# Patient Record
Sex: Male | Born: 1950 | Race: White | Hispanic: No | Marital: Married | Smoking: Never smoker
Health system: Southern US, Community
[De-identification: ages and names within clinical notes are randomized; demographics above are authoritative.]

## PROBLEM LIST (undated history)

## (undated) DIAGNOSIS — M1990 Unspecified osteoarthritis, unspecified site: Secondary | ICD-10-CM

## (undated) DIAGNOSIS — E2740 Unspecified adrenocortical insufficiency: Secondary | ICD-10-CM

## (undated) DIAGNOSIS — J302 Other seasonal allergic rhinitis: Secondary | ICD-10-CM

## (undated) DIAGNOSIS — D352 Benign neoplasm of pituitary gland: Secondary | ICD-10-CM

## (undated) DIAGNOSIS — E039 Hypothyroidism, unspecified: Secondary | ICD-10-CM

## (undated) HISTORY — PX: SHOULDER SURGERY: SHX246

---

## 1989-01-21 HISTORY — PX: HERNIA REPAIR: SHX51

## 1998-01-21 HISTORY — PX: HAND SURGERY: SHX662

## 1998-04-06 ENCOUNTER — Ambulatory Visit (HOSPITAL_COMMUNITY): Admission: RE | Admit: 1998-04-06 | Discharge: 1998-04-06 | Payer: Self-pay | Admitting: Gastroenterology

## 2004-06-26 ENCOUNTER — Ambulatory Visit (HOSPITAL_BASED_OUTPATIENT_CLINIC_OR_DEPARTMENT_OTHER): Admission: RE | Admit: 2004-06-26 | Discharge: 2004-06-27 | Payer: Self-pay | Admitting: Orthopedic Surgery

## 2004-06-26 ENCOUNTER — Ambulatory Visit (HOSPITAL_COMMUNITY): Admission: RE | Admit: 2004-06-26 | Discharge: 2004-06-26 | Payer: Self-pay | Admitting: Orthopedic Surgery

## 2008-11-10 ENCOUNTER — Encounter: Admission: RE | Admit: 2008-11-10 | Discharge: 2008-11-10 | Payer: Self-pay | Admitting: Internal Medicine

## 2009-02-21 HISTORY — PX: PITUITARY EXCISION: SHX745

## 2009-02-22 ENCOUNTER — Inpatient Hospital Stay (HOSPITAL_COMMUNITY): Admission: AD | Admit: 2009-02-22 | Discharge: 2009-02-25 | Payer: Self-pay | Admitting: Internal Medicine

## 2009-03-03 DIAGNOSIS — E2749 Other adrenocortical insufficiency: Secondary | ICD-10-CM | POA: Insufficient documentation

## 2009-03-08 ENCOUNTER — Encounter (INDEPENDENT_AMBULATORY_CARE_PROVIDER_SITE_OTHER): Payer: Self-pay | Admitting: Neurosurgery

## 2009-03-08 ENCOUNTER — Inpatient Hospital Stay (HOSPITAL_COMMUNITY): Admission: RE | Admit: 2009-03-08 | Discharge: 2009-03-11 | Payer: Self-pay | Admitting: Neurosurgery

## 2009-11-23 ENCOUNTER — Ambulatory Visit (HOSPITAL_BASED_OUTPATIENT_CLINIC_OR_DEPARTMENT_OTHER): Admission: RE | Admit: 2009-11-23 | Discharge: 2009-11-24 | Payer: Self-pay | Admitting: Orthopedic Surgery

## 2010-02-11 ENCOUNTER — Encounter: Payer: Self-pay | Admitting: Orthopedic Surgery

## 2010-04-03 LAB — POCT HEMOGLOBIN-HEMACUE: Hemoglobin: 15.6 g/dL (ref 13.0–17.0)

## 2010-04-04 LAB — BASIC METABOLIC PANEL
BUN: 16 mg/dL (ref 6–23)
CO2: 31 mEq/L (ref 19–32)
Creatinine, Ser: 1.06 mg/dL (ref 0.4–1.5)
Glucose, Bld: 94 mg/dL (ref 70–99)
Potassium: 4 mEq/L (ref 3.5–5.1)
Sodium: 140 mEq/L (ref 135–145)

## 2010-04-11 LAB — BASIC METABOLIC PANEL
BUN: 16 mg/dL (ref 6–23)
BUN: 18 mg/dL (ref 6–23)
BUN: 20 mg/dL (ref 6–23)
CO2: 24 mEq/L (ref 19–32)
CO2: 31 mEq/L (ref 19–32)
Calcium: 8 mg/dL — ABNORMAL LOW (ref 8.4–10.5)
Calcium: 9.6 mg/dL (ref 8.4–10.5)
Chloride: 102 mEq/L (ref 96–112)
Chloride: 105 mEq/L (ref 96–112)
Chloride: 107 mEq/L (ref 96–112)
Creatinine, Ser: 0.86 mg/dL (ref 0.4–1.5)
Creatinine, Ser: 0.98 mg/dL (ref 0.4–1.5)
GFR calc Af Amer: >60 mL/min (ref >60)
GFR calc Af Amer: >60 mL/min (ref >60)
GFR calc Af Amer: >60 mL/min (ref >60)
GFR calc non Af Amer: >60 mL/min (ref >60)
GFR calc non Af Amer: >60 mL/min (ref >60)
GFR calc non Af Amer: >60 mL/min (ref >60)
Glucose, Bld: 98 mg/dL (ref 70–99)
Potassium: 3.6 mEq/L (ref 3.5–5.1)
Sodium: 135 mEq/L (ref 135–145)
Sodium: 136 mEq/L (ref 135–145)
Sodium: 140 mEq/L (ref 135–145)
Sodium: 143 mEq/L (ref 135–145)

## 2010-04-11 LAB — TYPE AND SCREEN
ABO/RH(D): O POS
Antibody Screen: NEGATIVE

## 2010-04-11 LAB — OSMOLALITY
Osmolality: 286 mOsm/kg (ref 275–300)
Osmolality: 293 mOsm/kg (ref 275–300)

## 2010-04-11 LAB — CBC
HCT: 38.6 % — ABNORMAL LOW (ref 39.0–52.0)
MCHC: 35.3 g/dL (ref 30.0–36.0)
Platelets: 233 10*3/uL (ref 150–400)
RDW: 13 % (ref 11.5–15.5)

## 2010-04-12 LAB — ACTH: C206 ACTH: 7 pg/mL — ABNORMAL LOW (ref 10–46)

## 2010-04-12 LAB — RENAL FUNCTION PANEL
Albumin: 4 g/dL (ref 3.5–5.2)
Albumin: 4.2 g/dL (ref 3.5–5.2)
BUN: 9 mg/dL (ref 6–23)
BUN: 9 mg/dL (ref 6–23)
CO2: 21 mEq/L (ref 19–32)
CO2: 23 mEq/L (ref 19–32)
CO2: 24 mEq/L (ref 19–32)
CO2: 25 mEq/L (ref 19–32)
Calcium: 8.1 mg/dL — ABNORMAL LOW (ref 8.4–10.5)
Chloride: 93 mEq/L — ABNORMAL LOW (ref 96–112)
Chloride: 96 mEq/L (ref 96–112)
Chloride: 97 mEq/L (ref 96–112)
Chloride: 97 mEq/L (ref 96–112)
Creatinine, Ser: 0.84 mg/dL (ref 0.4–1.5)
Creatinine, Ser: 1.16 mg/dL (ref 0.4–1.5)
GFR calc Af Amer: >60 mL/min (ref >60)
GFR calc Af Amer: >60 mL/min (ref >60)
GFR calc Af Amer: >60 mL/min (ref >60)
GFR calc non Af Amer: >60 mL/min (ref >60)
GFR calc non Af Amer: >60 mL/min (ref >60)
GFR calc non Af Amer: >60 mL/min (ref >60)
GFR calc non Af Amer: >60 mL/min (ref >60)
Glucose, Bld: 103 mg/dL — ABNORMAL HIGH (ref 70–99)
Glucose, Bld: 108 mg/dL — ABNORMAL HIGH (ref 70–99)
Phosphorus: 2.2 mg/dL — ABNORMAL LOW (ref 2.3–4.6)
Phosphorus: 2.6 mg/dL (ref 2.3–4.6)
Potassium: 4 mEq/L (ref 3.5–5.1)
Potassium: 4.3 mEq/L (ref 3.5–5.1)
Potassium: 4.5 mEq/L (ref 3.5–5.1)
Potassium: 4.7 mEq/L (ref 3.5–5.1)
Sodium: 127 mEq/L — ABNORMAL LOW (ref 135–145)
Sodium: 128 mEq/L — ABNORMAL LOW (ref 135–145)

## 2010-04-12 LAB — CBC
HCT: 38.1 % — ABNORMAL LOW (ref 39.0–52.0)
MCHC: 35.3 g/dL (ref 30.0–36.0)
MCV: 90.7 fL (ref 78.0–100.0)
RBC: 4.2 MIL/uL — ABNORMAL LOW (ref 4.22–5.81)
RDW: 12.8 % (ref 11.5–15.5)
WBC: 2.9 10*3/uL — ABNORMAL LOW (ref 4.0–10.5)

## 2010-04-12 LAB — BASIC METABOLIC PANEL
BUN: 18 mg/dL (ref 6–23)
BUN: 9 mg/dL (ref 6–23)
Calcium: 9 mg/dL (ref 8.4–10.5)
Chloride: 87 mEq/L — ABNORMAL LOW (ref 96–112)
GFR calc non Af Amer: >60 mL/min (ref >60)
Glucose, Bld: 102 mg/dL — ABNORMAL HIGH (ref 70–99)
Glucose, Bld: 88 mg/dL (ref 70–99)
Potassium: 3.9 mEq/L (ref 3.5–5.1)
Potassium: 4.1 mEq/L (ref 3.5–5.1)
Sodium: 115 mEq/L — CL (ref 135–145)
Sodium: 131 mEq/L — ABNORMAL LOW (ref 135–145)

## 2010-04-12 LAB — URINALYSIS, MICROSCOPIC ONLY
Bilirubin Urine: NEGATIVE
Glucose, UA: NEGATIVE mg/dL
Ketones, ur: NEGATIVE mg/dL
Leukocytes, UA: NEGATIVE
Nitrite: NEGATIVE
Protein, ur: NEGATIVE mg/dL
pH: 7.5 (ref 5.0–8.0)

## 2010-04-12 LAB — INSULIN-LIKE GROWTH FACTOR: Somatomedin C: 284

## 2010-04-12 LAB — CORTISOL: Cortisol, Plasma: 7.8 ug/dL

## 2010-04-12 LAB — OSMOLALITY: Osmolality: 238 mOsm/kg — CL (ref 275–300)

## 2010-04-12 LAB — FOLLICLE STIMULATING HORMONE: FSH: 6.4 m[IU]/mL (ref 1.4–18.1)

## 2010-05-14 ENCOUNTER — Other Ambulatory Visit: Payer: Self-pay | Admitting: Neurosurgery

## 2010-05-14 DIAGNOSIS — D497 Neoplasm of unspecified behavior of endocrine glands and other parts of nervous system: Secondary | ICD-10-CM

## 2010-05-21 ENCOUNTER — Ambulatory Visit
Admission: RE | Admit: 2010-05-21 | Discharge: 2010-05-21 | Disposition: A | Discharge status: Home / Self Care | Payer: BC Managed Care – PPO | Source: Ambulatory Visit | Attending: Neurosurgery | Admitting: Neurosurgery

## 2010-05-21 DIAGNOSIS — D497 Neoplasm of unspecified behavior of endocrine glands and other parts of nervous system: Secondary | ICD-10-CM

## 2010-05-21 MED ORDER — GADOBENATE DIMEGLUMINE 529 MG/ML IV SOLN
15.0000 mL | Freq: Once | INTRAVENOUS | Status: AC | PRN
  Administered 2010-05-21: 15 mL via INTRAVENOUS

## 2010-06-08 NOTE — Op Note (Signed)
Seth Estrada, Seth Estrada                     ACCOUNT NO.:  [id number]   MEDICAL RECORD NO.:  [id number]          PATIENT TYPE:  AMB   LOCATION:  DSC                          FACILITY:  [facility]   PHYSICIAN:  Katy Fitch. Sypher, M.D. DATE OF BIRTH:  05-09-1950   DATE OF PROCEDURE:  06/26/2004  DATE OF DISCHARGE:                                 OPERATIVE REPORT   PREOPERATIVE DIAGNOSIS:  Chronic left rotator cuff tear diagnosed in 2005,  status post chronic sub AC impingement and adhesive capsulitis for more than  12 months with MRI proven retracted degenerative tear of supraspinatus and  anterior infraspinatus for more than one year.   POSTOPERATIVE DIAGNOSIS:  Chronic left rotator cuff tear diagnosed in 2005,  status post chronic sub AC impingement and adhesive capsulitis for more than  12 months with MRI proven retracted degenerative tear of supraspinatus and  anterior infraspinatus for more than one year.   OPERATION:  1.  Diagnostic arthroscopy left glenohumeral joint followed by arthroscopic      capsulectomy and debridement of labral tear.  2.  Arthroscopic subacromial decompression with coracoacromial ligament      release and acromioplasty.  3.  Open resection of distal clavicle, i.e. Mumford procedure.  4.  Open reconstruction of retracted chronic supraspinatus and infraspinatus      rotator cuff tear.   OPERATING SURGEON:  Katy Fitch. Sypher, M.D.   ASSISTANT:  Molly Maduro Dasnoit PA-C.   ANESTHESIA:  General by endotracheal technique supplemented by interscalene  block.   SUPERVISING ANESTHESIOLOGIST:  Dr. Sheldon Silvan.   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 Muscle testing suggested  either tear of the  rotator cuff or significant tendinopathy of the  supraspinatus and infraspinatus.   An MRI was obtained in 2005 that documented a small anterior supraspinatus  rotator cuff tear. The [facility] joint was significantly degenerative. The acromial  morphology was type II at the [facility] joint and at the anterior lateral margin.   At the time, we recommended that Mr. Cupples consider arthroscopic  intervention and rotator cuff repair.   As he is planning on sailing off shore in the [place] for approximately 60 months, he elected to postpone surgery and perform a therapy program.   He returned in April 2006, and was noted to have signs of a chronic rotator  cuff tear. He was returned to the MRI unit for a study in  May 2006, which  documented a substantial retracted rotator cuff tear.   He is brought to the operating at this time to address his multiple shoulder  pathologies.   Preoperatively, he was advised the potential risks and benefits of surgery.  Question were provided and answered.   PROCEDURE:  Seth Estrada is brought to the operating room and placed in  supine position  on the operating table.   Following induction of general endotracheal anesthesia under direct  supervision of Dr. Sheldon Silvan,  Mr. Cookson was carefully positioned in the  beach-chair position with an aid of a torso and head holder designed for  shoulder arthroscopy.   Examination of the left shoulder under anesthesia revealed moderate adhesive  capsulitis with elevation 165, external rotation 75, internal rotation of 60  degrees.   With gentle manipulation, we increased the external rotation to 90 degrees  but did not attempt to increase the abduction or internal rotation.   The left arm was then prepped with DuraPrep and draped with impervious  arthroscopy drapes.   The shoulder joint was distended with 20 mL of sterile saline, brought in  with a spinal needle anteriorly followed by placement of the  arthroscope  through a standard posterior viewing portal. Diagnostic arthroscopy  documented adhesive capsulitis with scarring of the anterior capsular  ligaments. There was degenerative changes in the labrum at 12 o'clock, 1  o'clock anteriorly, 3 o'clock anteriorly and posteriorly and inferiorly the  labrum appeared to be intact.   The anterior glenohumeral ligaments were covered with granulation tissues  and were moderately adherent to the subscapularis tendon.   An anterior portal was created under direct vision followed by use of a  ArthroCare wand to take down the adhesions and a 4.5-mm suction shaver to  debride the adhesions and tenolyse the subscapularis tendon.   The origin of the biceps was stable. The biceps tendon was normal to the  rotator interval where there was a small inferior anterior partial-thickness  biceps tear due to chronic impingement. The deep surface the supraspinatus  and infraspinatus were easily visualized and found to have a necrotic  degenerative tear with some overlying scarring. There was an attempted deep  surface partial healing. However, this is poor quality scar.   After completion of hemostasis in the glenohumeral joint, the scope was  placed in subacromial space. A chronic bursitis was noted.   After bursectomy, the anatomy of the coracoacromial arch was studied. There  is clear prominence of the medial acromion at the [facility] joint and a  degenerative AC joint capsule. The cutting cautery was used to release the  coracoacromial ligament followed by leveling the acromion to a type I  morphology and hemostasis with the bipolar device.   Mr. Melucci had chronic capillary bleeding suggestive of chronic aspirin use  and/or other agents that may have inhibited his platelets.   We ultimately elected to proceed with open resection of the distal clavicle  due to bleeding that we encountered that made visualization challenging despite maintaining a systolic  blood pressure between 110 and 105.   The procedure then advanced to an open resection of the distal clavicle  through a 2 cm incision centered over the distal clavicle. Subperiosteal  dissection revealed the [facility] joint capsule which was taken down with scalpel  and sharp osteotomes. We once again encountered very difficult bleeding that  was challenging to control even with Bovie cautery. Mr. Yeley had been  taking aspirin 81 mg daily and had stopped one week prior surgery but still  had obviously impaired platelet function.   After resection of the distal 15 mm of clavicle, hemostasis was achieved by  packing the wound. We then split the anterior middle thirds of the deltoid  without significant release of the anterior deltoid. The retracted  degenerative rotator cuff tear was identified. The acromion was further  resected  with a hand rasp laterally to feather the margin with a smooth  upturn.   The necrotic margins of the supraspinatus and infraspinatus were retracted  followed by use of a rongeur to debride the deep surface. The greater  tuberosity was decorticated over a distance of 4 cm from the biceps interval  posteriorly followed by placement to [employer]crew anchors, one at the  central portion of the supraspinatus and one at the junction of  supraspinatus and infraspinatus.   Approximately 1.5 cm of necrotic cuff were resected followed by advancement  of the healthy appearing supraspinatus and infraspinatus laterally. The  tendons were inset with a total of three through bone McLaughlin type  sutures and a total of four mattress sutures inset along the joint margin to  create a broad footprint insetting the medial foot print of the  supraspinatus and infraspinatus.   The cuff repair was noted be quite satisfactory with a smooth margin.   The deltoid was then repaired to the periosteum and capsule of the [facility] joint  with mattress sutures with knots inverted, reconstructing the  deltoid split  interval and the anterior third of deltoid was repaired to the trapezius  closing the dead space created by resection of the distal clavicle.   Throughout the dissection, we had oozing that led to a blood loss of  approximate 300 mL. We may ask Mr. Wigington to have qualitative platelet  function checked prior to considering further surgery.   The wound was then repaired subdermal sutures of 2-0 Vicryl and intradermal  3-0 Prolene.   There were otherwise no apparent complications. Mr. Wien tolerated surgery  and anesthesia well. He was transferred to the recovery room with stable  vital signs.   He will be admitted to the recovery care center for observation of his vital  signs and appropriate analgesics in the form of IV and p.o. Dilaudid and PCA  morphine. We anticipate Ancef 1 g IV x3 doses over 24 hours.       RVS/MEDQ  D:  06/26/2004  T:  06/26/2004  Job:  [id number]

## 2012-02-05 ENCOUNTER — Other Ambulatory Visit: Payer: Self-pay | Admitting: Neurosurgery

## 2012-02-05 DIAGNOSIS — D497 Neoplasm of unspecified behavior of endocrine glands and other parts of nervous system: Secondary | ICD-10-CM

## 2012-03-03 ENCOUNTER — Ambulatory Visit
Admission: RE | Admit: 2012-03-03 | Discharge: 2012-03-03 | Disposition: A | Discharge status: Home / Self Care | Payer: BC Managed Care – PPO | Source: Ambulatory Visit | Attending: Neurosurgery | Admitting: Neurosurgery

## 2012-03-03 DIAGNOSIS — D497 Neoplasm of unspecified behavior of endocrine glands and other parts of nervous system: Secondary | ICD-10-CM

## 2012-03-03 MED ORDER — GADOBENATE DIMEGLUMINE 529 MG/ML IV SOLN
12.0000 mL | Freq: Once | INTRAVENOUS | Status: AC | PRN
  Administered 2012-03-03: 12 mL via INTRAVENOUS

## 2013-03-01 ENCOUNTER — Other Ambulatory Visit: Payer: Self-pay | Admitting: Neurosurgery

## 2013-03-01 DIAGNOSIS — Z9889 Other specified postprocedural states: Secondary | ICD-10-CM

## 2013-03-30 ENCOUNTER — Other Ambulatory Visit: Payer: BC Managed Care – PPO

## 2013-04-01 ENCOUNTER — Ambulatory Visit
Admission: RE | Admit: 2013-04-01 | Discharge: 2013-04-01 | Disposition: A | Discharge status: Home / Self Care | Payer: BC Managed Care – PPO | Source: Ambulatory Visit | Attending: Neurosurgery | Admitting: Neurosurgery

## 2013-04-01 DIAGNOSIS — Z9889 Other specified postprocedural states: Secondary | ICD-10-CM

## 2013-04-01 MED ORDER — GADOBENATE DIMEGLUMINE 529 MG/ML IV SOLN
10.0000 mL | Freq: Once | INTRAVENOUS | Status: AC | PRN
  Administered 2013-04-01: 10 mL via INTRAVENOUS

## 2014-01-30 ENCOUNTER — Observation Stay (HOSPITAL_COMMUNITY)
Admission: EM | Admit: 2014-01-30 | Discharge: 2014-01-31 | Disposition: A | Discharge status: Home / Self Care | Payer: BLUE CROSS/BLUE SHIELD | Attending: Internal Medicine | Admitting: Internal Medicine

## 2014-01-30 ENCOUNTER — Encounter (HOSPITAL_COMMUNITY): Payer: Self-pay | Admitting: Emergency Medicine

## 2014-01-30 DIAGNOSIS — Z8249 Family history of ischemic heart disease and other diseases of the circulatory system: Secondary | ICD-10-CM | POA: Insufficient documentation

## 2014-01-30 DIAGNOSIS — R112 Nausea with vomiting, unspecified: Secondary | ICD-10-CM | POA: Diagnosis present

## 2014-01-30 DIAGNOSIS — R197 Diarrhea, unspecified: Secondary | ICD-10-CM | POA: Diagnosis present

## 2014-01-30 DIAGNOSIS — I959 Hypotension, unspecified: Secondary | ICD-10-CM

## 2014-01-30 DIAGNOSIS — E860 Dehydration: Secondary | ICD-10-CM

## 2014-01-30 DIAGNOSIS — E271 Primary adrenocortical insufficiency: Secondary | ICD-10-CM

## 2014-01-30 DIAGNOSIS — E2740 Unspecified adrenocortical insufficiency: Principal | ICD-10-CM | POA: Diagnosis present

## 2014-01-30 DIAGNOSIS — Z800 Family history of malignant neoplasm of digestive organs: Secondary | ICD-10-CM | POA: Insufficient documentation

## 2014-01-30 HISTORY — DX: Unspecified adrenocortical insufficiency: E27.40

## 2014-01-30 HISTORY — DX: Benign neoplasm of pituitary gland: D35.2

## 2014-01-30 LAB — URINALYSIS, ROUTINE W REFLEX MICROSCOPIC
Bilirubin Urine: NEGATIVE
Glucose, UA: NEGATIVE mg/dL
Hgb urine dipstick: NEGATIVE
Ketones, ur: NEGATIVE mg/dL
LEUKOCYTES UA: NEGATIVE
NITRITE: NEGATIVE
Protein, ur: NEGATIVE mg/dL
Specific Gravity, Urine: 1.013 (ref 1.005–1.030)
Urobilinogen, UA: 0.2 mg/dL (ref 0.0–1.0)
pH: 7 (ref 5.0–8.0)

## 2014-01-30 LAB — COMPREHENSIVE METABOLIC PANEL
ALBUMIN: 4.5 g/dL (ref 3.5–5.2)
ALT: 18 U/L (ref 0–53)
AST: 27 U/L (ref 0–37)
Alkaline Phosphatase: 47 U/L (ref 39–117)
Anion gap: 7 (ref 5–15)
BUN: 25 mg/dL — ABNORMAL HIGH (ref 6–23)
CALCIUM: 8.9 mg/dL (ref 8.4–10.5)
CO2: 27 mmol/L (ref 19–32)
Chloride: 105 mEq/L (ref 96–112)
Creatinine, Ser: 1.2 mg/dL (ref 0.50–1.35)
GFR calc Af Amer: 73 mL/min — ABNORMAL LOW (ref >90)
GFR, EST NON AFRICAN AMERICAN: 63 mL/min — AB (ref >90)
Glucose, Bld: 107 mg/dL — ABNORMAL HIGH (ref 70–99)
Potassium: 3.8 mmol/L (ref 3.5–5.1)
Sodium: 139 mmol/L (ref 135–145)
TOTAL PROTEIN: 7 g/dL (ref 6.0–8.3)
Total Bilirubin: 1.2 mg/dL (ref 0.3–1.2)

## 2014-01-30 LAB — CBC WITH DIFFERENTIAL/PLATELET
BASOS ABS: 0 10*3/uL (ref 0.0–0.1)
Basophils Relative: 0 % (ref 0–1)
EOS PCT: 0 % (ref 0–5)
Eosinophils Absolute: 0 10*3/uL (ref 0.0–0.7)
HEMATOCRIT: 50.7 % (ref 39.0–52.0)
Hemoglobin: 16.9 g/dL (ref 13.0–17.0)
Lymphocytes Relative: 8 % — ABNORMAL LOW (ref 12–46)
Lymphs Abs: 0.9 10*3/uL (ref 0.7–4.0)
MCH: 31.4 pg (ref 26.0–34.0)
MCHC: 33.3 g/dL (ref 30.0–36.0)
MCV: 94.2 fL (ref 78.0–100.0)
MONO ABS: 0.4 10*3/uL (ref 0.1–1.0)
Monocytes Relative: 4 % (ref 3–12)
Neutro Abs: 9 10*3/uL — ABNORMAL HIGH (ref 1.7–7.7)
Neutrophils Relative %: 88 % — ABNORMAL HIGH (ref 43–77)
PLATELETS: 140 10*3/uL — AB (ref 150–400)
RBC: 5.38 MIL/uL (ref 4.22–5.81)
RDW: 12.8 % (ref 11.5–15.5)
WBC: 10.3 10*3/uL (ref 4.0–10.5)

## 2014-01-30 LAB — CORTISOL: Cortisol, Plasma: 32.2 ug/dL

## 2014-01-30 LAB — LIPASE, BLOOD: Lipase: 36 U/L (ref 11–59)

## 2014-01-30 LAB — TROPONIN I
Troponin I: <0.03 ng/mL (ref <0.031)
Troponin I: <0.03 ng/mL (ref <0.031)
Troponin I: <0.03 ng/mL (ref <0.031)

## 2014-01-30 LAB — TSH: TSH: 1.029 u[IU]/mL (ref 0.350–4.500)

## 2014-01-30 MED ORDER — ASPIRIN EC 81 MG PO TBEC
81.0000 mg | DELAYED_RELEASE_TABLET | Freq: Every day | ORAL | Status: DC
  Administered 2014-01-30 – 2014-01-31 (×2): 81 mg via ORAL
  Filled 2014-01-30 (×2): qty 1

## 2014-01-30 MED ORDER — HYDROCORTISONE NA SUCCINATE PF 100 MG IJ SOLR
50.0000 mg | Freq: Three times a day (TID) | INTRAMUSCULAR | Status: DC
  Administered 2014-01-30 – 2014-01-31 (×4): 50 mg via INTRAVENOUS
  Filled 2014-01-30 (×7): qty 1

## 2014-01-30 MED ORDER — ACETAMINOPHEN 325 MG PO TABS
650.0000 mg | ORAL_TABLET | Freq: Four times a day (QID) | ORAL | Status: DC | PRN
  Administered 2014-01-30 – 2014-01-31 (×2): 650 mg via ORAL
  Filled 2014-01-30 (×2): qty 2

## 2014-01-30 MED ORDER — SODIUM CHLORIDE 0.9 % IV SOLN: INTRAVENOUS | Status: DC

## 2014-01-30 MED ORDER — LEVOTHYROXINE SODIUM 50 MCG PO TABS
50.0000 ug | ORAL_TABLET | Freq: Every day | ORAL | Status: DC
  Administered 2014-01-30 – 2014-01-31 (×2): 50 ug via ORAL
  Filled 2014-01-30 (×3): qty 1

## 2014-01-30 MED ORDER — METHYLPREDNISOLONE SODIUM SUCC 125 MG IJ SOLR
80.0000 mg | Freq: Once | INTRAMUSCULAR | Status: DC
  Filled 2014-01-30: qty 1.28

## 2014-01-30 MED ORDER — SODIUM CHLORIDE 0.9 % IV SOLN
1000.0000 mL | INTRAVENOUS | Status: DC
  Administered 2014-01-30 (×2): 1000 mL via INTRAVENOUS

## 2014-01-30 MED ORDER — SODIUM CHLORIDE 0.9 % IV BOLUS (SEPSIS)
1000.0000 mL | Freq: Once | INTRAVENOUS | Status: AC
  Administered 2014-01-30: 1000 mL via INTRAVENOUS

## 2014-01-30 MED ORDER — ACETAMINOPHEN 650 MG RE SUPP: 650.0000 mg | Freq: Four times a day (QID) | RECTAL | Status: DC | PRN

## 2014-01-30 MED ORDER — ONDANSETRON HCL 4 MG/2ML IJ SOLN: 4.0000 mg | Freq: Four times a day (QID) | INTRAMUSCULAR | Status: DC | PRN

## 2014-01-30 MED ORDER — HYDROCORTISONE NA SUCCINATE PF 100 MG IJ SOLR: 100.0000 mg | Freq: Once | INTRAMUSCULAR | Status: DC

## 2014-01-30 MED ORDER — TESTOSTERONE CYPIONATE 200 MG/ML IM SOLN: 120.0000 mg | INTRAMUSCULAR | Status: DC

## 2014-01-30 MED ORDER — HYDROCORTISONE 5 MG PO TABS
25.0000 mg | ORAL_TABLET | Freq: Every day | ORAL | Status: DC
  Filled 2014-01-30 (×2): qty 1

## 2014-01-30 MED ORDER — HYDROCORTISONE SOD SUCCINATE 100 MG PF FOR IT USE: 100.0000 mg | Freq: Once | INTRAMUSCULAR | Status: DC

## 2014-01-30 MED ORDER — SODIUM CHLORIDE 0.9 % IV SOLN
1000.0000 mL | Freq: Once | INTRAVENOUS | Status: AC
  Administered 2014-01-30: 1000 mL via INTRAVENOUS

## 2014-01-30 MED ORDER — SODIUM CHLORIDE 0.9 % IV SOLN
1000.0000 mL | INTRAVENOUS | Status: DC
  Administered 2014-01-30 – 2014-01-31 (×2): 1000 mL via INTRAVENOUS

## 2014-01-30 MED ORDER — ONDANSETRON HCL 4 MG/2ML IJ SOLN: 4.0000 mg | Freq: Three times a day (TID) | INTRAMUSCULAR | Status: DC | PRN

## 2014-01-30 MED ORDER — ROSUVASTATIN CALCIUM 5 MG PO TABS
5.0000 mg | ORAL_TABLET | Freq: Every day | ORAL | Status: DC
  Administered 2014-01-30: 5 mg via ORAL
  Filled 2014-01-30 (×2): qty 1

## 2014-01-30 MED ORDER — HYDROCORTISONE NA SUCCINATE PF 100 MG IJ SOLR
100.0000 mg | Freq: Once | INTRAMUSCULAR | Status: DC
  Filled 2014-01-30: qty 2

## 2014-01-30 MED ORDER — SODIUM CHLORIDE 0.9 % IJ SOLN
3.0000 mL | Freq: Two times a day (BID) | INTRAMUSCULAR | Status: DC
  Administered 2014-01-30: 3 mL via INTRAVENOUS

## 2014-01-30 MED ORDER — HYDROCORTISONE NA SUCCINATE PF 100 MG IJ SOLR
100.0000 mg | Freq: Once | INTRAMUSCULAR | Status: AC
  Administered 2014-01-30: 100 mg via INTRAVENOUS
  Filled 2014-01-30: qty 2

## 2014-01-30 NOTE — ED Provider Notes (Signed)
CSN: [id number]     Arrival date & time 01/30/14  0113 History   First MD Initiated Contact with Patient 01/30/14 0207     Chief Complaint  Patient presents with  . Emesis  . Diarrhea     (Consider location/radiation/quality/duration/timing/severity/associated sxs/prior Treatment) HPI Patient has a history of adrenal insufficiency. He states he has taken all of his hydrocortisone today. He states he had a bloated feeling all afternoon and about 10:30 this evening he started having vomiting about 3 times. He also had diarrhea twice that was very watery. He complains of feeling shaky. He denies being lightheaded or dizzy. His wife drove him to the ED. He denies being around anybody else who is sick. He denies eating anything that he thinks has made him feel bad.    PCP Dr America Brown Endocrinology Dr Buddy Duty  Past Medical History  Diagnosis Date  . Renal disorder   . Adrenal insufficiency   . Pituitary adenoma    Past Surgical History  Procedure Laterality Date  . Pituitary excision  02/2009    [facility]  . Shoulder surgery      bilateral rotator cuff  . Hand surgery    . Hernia repair     Family History  Problem Relation Age of Onset  . CAD Mother   . Colon cancer Father    History  Substance Use Topics  . Smoking status: Never Smoker   . Smokeless tobacco: Never Used  . Alcohol Use: No  lives at home Lives with spouse retired  Review of Systems  All other systems reviewed and are negative.     Allergies  Review of patient's allergies indicates no known allergies.  Home Medications   Prior to Admission medications   Medication Sig Start Date End Date Taking? Authorizing Provider  aspirin EC 81 MG tablet Take 81 mg by mouth daily.   Yes Historical Provider, MD  hydrocortisone (CORTEF) 10 MG tablet 15 mg in the morning, 5 mg in the early afternoon, 5 mg in the late afternoon 12/04/13  Yes Historical Provider, MD  levothyroxine (SYNTHROID, LEVOTHROID) 50 MCG  tablet Take 50 mcg by mouth.   Yes Historical Provider, MD  rosuvastatin (CRESTOR) 5 MG tablet Take 5 mg by mouth.   Yes Historical Provider, MD  testosterone cypionate (DEPOTESTOTERONE CYPIONATE) 200 MG/ML injection Inject 0.6 mLs into the muscle every 14 (fourteen) days. 12/13/13  Yes Historical Provider, MD   ED Triage Vitals  Enc Vitals Group     BP 01/30/14 0119 108/94 mmHg     Pulse Rate 01/30/14 0119 111     Resp 01/30/14 0119 18     Temp 01/30/14 0119 98.3 F (36.8 C)     Temp Source 01/30/14 0119 Oral     SpO2 01/30/14 0119 97 %     Weight 01/30/14 0119 205 lb (92.987 kg)     Height 01/30/14 0119 5\' 11"  (1.803 m)     Head Cir --      Peak Flow --      Pain Score 01/30/14 0130 0     Pain Loc --      Pain Edu? --      Excl. in [place]? --     Vital signs normal except for tachycardia    Physical Exam  Constitutional: He is oriented to person, place, and time. He appears well-developed and well-nourished.  Non-toxic appearance. He does not appear ill. He appears distressed.  HENT:  Head: Normocephalic and  atraumatic.  Right Ear: External ear normal.  Left Ear: External ear normal.  Nose: Nose normal. No mucosal edema or rhinorrhea.  Mouth/Throat: Mucous membranes are normal. No dental abscesses or uvula swelling.  Tongue is dry  Eyes: Conjunctivae and EOM are normal. Pupils are equal, round, and reactive to light.  Neck: Normal range of motion and full passive range of motion without pain. Neck supple.  Cardiovascular: Normal rate, regular rhythm and normal heart sounds.  Exam reveals no gallop and no friction rub.   No murmur heard. Pulmonary/Chest: Effort normal and breath sounds normal. No respiratory distress. He has no wheezes. He has no rhonchi. He has no rales. He exhibits no tenderness and no crepitus.  Abdominal: Soft. Normal appearance and bowel sounds are normal. He exhibits no distension. There is no tenderness. There is no rebound and no guarding.   Musculoskeletal: Normal range of motion. He exhibits no edema or tenderness.  Moves all extremities well.   Neurological: He is alert and oriented to person, place, and time. He has normal strength. No cranial nerve deficit.  Patient appear shaky  Skin: Skin is warm, dry and intact. No rash noted. No erythema. No pallor.  Psychiatric: His speech is normal and behavior is normal. His mood appears anxious.  Nursing note and vitals reviewed.   ED Course  Procedures (including critical care time)  Medications  0.9 %  sodium chloride infusion (0 mLs Intravenous Stopped 01/30/14 0317)    Followed by  0.9 %  sodium chloride infusion (0 mLs Intravenous Stopped 01/30/14 0600)    Followed by  0.9 %  sodium chloride infusion (not administered)  hydrocortisone sodium succinate (SOLU-CORTEF) 100 MG injection 100 mg (not administered)  hydrocortisone sodium succinate (SOLU-CORTEF) 100 MG injection 100 mg (100 mg Intravenous Given 01/30/14 0236)  sodium chloride 0.9 % bolus 1,000 mL (0 mLs Intravenous Stopped 01/30/14 0600)   Shortly after patient was placed in a room he dropped his blood pressure to 89/21 systolic. 2 IVs were started for IV fluids. His blood pressure responded to IV fluids and 100 mg of IV solucortef.   04:00 Patient was rechecked and is feeling better. He has not had urinary output after 2 L of normal saline. A third liter was ordered. Patient also is agreeable to trying oral fluids.  Recheck 05:45 pt is in the bathroom having 3rd episode of watery diarrhea since he has been to the ED. States he is feeling better. Is agreeable for obs admission b/o his adrenal insufficiency.   06:02 Dr Maudie Mercury, admit to observation  Labs Review Results for orders placed or performed during the hospital encounter of 01/30/14  CBC with Differential  Result Value Ref Range   WBC 10.3 4.0 - 10.5 K/uL   RBC 5.38 4.22 - 5.81 MIL/uL   Hemoglobin 16.9 13.0 - 17.0 g/dL   HCT 50.7 39.0 - 52.0 %   MCV 94.2  78.0 - 100.0 fL   MCH 31.4 26.0 - 34.0 pg   MCHC 33.3 30.0 - 36.0 g/dL   RDW 12.8 11.5 - 15.5 %   Platelets 140 (L) 150 - 400 K/uL   Neutrophils Relative % 88 (H) 43 - 77 %   Neutro Abs 9.0 (H) 1.7 - 7.7 K/uL   Lymphocytes Relative 8 (L) 12 - 46 %   Lymphs Abs 0.9 0.7 - 4.0 K/uL   Monocytes Relative 4 3 - 12 %   Monocytes Absolute 0.4 0.1 - 1.0 K/uL   Eosinophils Relative  0 0 - 5 %   Eosinophils Absolute 0.0 0.0 - 0.7 K/uL   Basophils Relative 0 0 - 1 %   Basophils Absolute 0.0 0.0 - 0.1 K/uL  Comprehensive metabolic panel  Result Value Ref Range   Sodium 139 135 - 145 mmol/L   Potassium 3.8 3.5 - 5.1 mmol/L   Chloride 105 96 - 112 mEq/L   CO2 27 19 - 32 mmol/L   Glucose, Bld 107 (H) 70 - 99 mg/dL   BUN 25 (H) 6 - 23 mg/dL   Creatinine, Ser 1.20 0.50 - 1.35 mg/dL   Calcium 8.9 8.4 - 10.5 mg/dL   Total Protein 7.0 6.0 - 8.3 g/dL   Albumin 4.5 3.5 - 5.2 g/dL   AST 27 0 - 37 U/L   ALT 18 0 - 53 U/L   Alkaline Phosphatase 47 39 - 117 U/L   Total Bilirubin 1.2 0.3 - 1.2 mg/dL   GFR calc non Af Amer 63 (L) >90 mL/min   GFR calc Af Amer 73 (L) >90 mL/min   Anion gap 7 5 - 15  Lipase, blood  Result Value Ref Range   Lipase 36 11 - 59 U/L  Urinalysis, Routine w reflex microscopic  Result Value Ref Range   Color, Urine YELLOW YELLOW   APPearance CLEAR CLEAR   Specific Gravity, Urine 1.013 1.005 - 1.030   pH 7.0 5.0 - 8.0   Glucose, UA NEGATIVE NEGATIVE mg/dL   Hgb urine dipstick NEGATIVE NEGATIVE   Bilirubin Urine NEGATIVE NEGATIVE   Ketones, ur NEGATIVE NEGATIVE mg/dL   Protein, ur NEGATIVE NEGATIVE mg/dL   Urobilinogen, UA 0.2 0.0 - 1.0 mg/dL   Nitrite NEGATIVE NEGATIVE   Leukocytes, UA NEGATIVE NEGATIVE   Laboratory interpretation all normal except mildly elevated BUN consistent with dehydration     Imaging Review No results found.   EKG Interpretation   Date/Time:  Sunday January 30 2014 02:06:36 EST Ventricular Rate:  85 PR Interval:  182 QRS Duration:  84 QT Interval:  375 QTC Calculation: 446 R Axis:   -28 Text Interpretation:  Sinus rhythm Borderline left axis deviation Low  voltage, extremity and precordial leads Since last tracing rate faster (02 Mar 2009) Confirmed by [facility]  MD-I, Raynisha Avilla ([id number]) on 01/30/2014 2:15:20 AM      MDM   Final diagnoses:  Nausea vomiting and diarrhea  Dehydration  Hypotension, unspecified hypotension type  Adrenal insufficiency (Addison's disease)    Plan admission to observation   Rolland Porter, MD, [place] Performed by: Rolland Porter L Total critical care time: 35 min Critical care time was exclusive of separately billable procedures and treating other patients. Critical care was necessary to treat or prevent imminent or life-threatening deterioration. Critical care was time spent personally by me on the following activities: development of treatment plan with patient and/or surrogate as well as nursing, discussions with consultants, evaluation of patient's response to treatment, examination of patient, obtaining history from patient or surrogate, ordering and performing treatments and interventions, ordering and review of laboratory studies, ordering and review of radiographic studies, pulse oximetry and re-evaluation of patient's condition.     Janice Norrie, MD 01/30/14 [phone]

## 2014-01-30 NOTE — ED Notes (Signed)
HX OF ADRENAL issues NOT RENAL

## 2014-01-30 NOTE — Progress Notes (Signed)
  [facility] PROGRESS NOTE   Seth Estrada OPF:[id number] DOB: 1950-11-25 DOA: 01/30/2014 PCP: Marjorie Smolder, MD  HPI/Subjective: Feels much better, denies any nausea vomiting this morning, still has loose stools. Patient seen and examined earlier today by my colleague Dr. Maudie Mercury.  Assessment/Plan: Active Problems:   Adrenal insufficiency   Nausea vomiting and diarrhea   Nausea vomiting and diarrhea Likely transient viral gastroenteritis Improving, advance diet as tolerated.  Adrenal insufficiency Placed on a stress dose of Solu-Cortef, 50 mg 3 times a day. Likely can be discharged tomorrow if no other complaints.  Code Status: Full code Family Communication: Plan discussed with the patient. Disposition Plan: Remains inpatient   Consultants:  None  Procedures:  None  Antibiotics:  None   Objective: Filed Vitals:   01/30/14 0655  BP: 123/74  Pulse: 95  Temp: 99.1 F (37.3 C)  Resp: 18   No intake or output data in the 24 hours ending 01/30/14 1254 Filed Weights   01/30/14 0119  Weight: 92.987 kg (205 lb)    Exam: General: Alert and awake, oriented x3, not in any acute distress. HEENT: anicteric sclera, pupils reactive to light and accommodation, EOMI CVS: S1-S2 clear, no murmur rubs or gallops Chest: clear to auscultation bilaterally, no wheezing, rales or rhonchi Abdomen: soft nontender, nondistended, normal bowel sounds, no organomegaly Extremities: no cyanosis, clubbing or edema noted bilaterally Neuro: Cranial nerves II-XII intact, no focal neurological deficits  Data Reviewed: Basic Metabolic Panel:  Recent Labs Lab 01/30/14 0208  NA 139  K 3.8  CL 105  CO2 27  GLUCOSE 107*  BUN 25*  CREATININE 1.20  CALCIUM 8.9   Liver Function Tests:  Recent Labs Lab 01/30/14 0208  AST 27  ALT 18  ALKPHOS 47  BILITOT 1.2  PROT 7.0  ALBUMIN 4.5    Recent Labs Lab 01/30/14 0208  LIPASE 36   No results for input(s): AMMONIA  in the last 168 hours. CBC:  Recent Labs Lab 01/30/14 0208  WBC 10.3  NEUTROABS 9.0*  HGB 16.9  HCT 50.7  MCV 94.2  PLT 140*   Cardiac Enzymes:  Recent Labs Lab 01/30/14 0710  TROPONINI <0.03   BNP (last 3 results) No results for input(s): PROBNP in the last 8760 hours. CBG: No results for input(s): GLUCAP in the last 168 hours.  Micro No results found for this or any previous visit (from the past 240 hour(s)).   Studies: No results found.  Scheduled Meds: . aspirin EC  81 mg Oral Daily  . hydrocortisone sod succinate (SOLU-CORTEF) inj  50 mg Intravenous Q8H  . levothyroxine  50 mcg Oral QAC breakfast  . rosuvastatin  5 mg Oral q1800  . sodium chloride  3 mL Intravenous Q12H   Continuous Infusions: . sodium chloride 1,000 mL (01/30/14 1206)       Time spent: 35 minutes    [facility] A  [facility] Pager [phone] If 7PM-7AM, please contact night-coverage at [email or web address], password [facility] 01/30/2014, 12:54 PM  LOS: 0 days

## 2014-01-30 NOTE — ED Notes (Signed)
Pt arrived to the ED with a complaint of sudden onset of emesis and diarrhea.  Pt states that he ate a large meal, felt bloated and then mad himself throw up around 2230.  Pt has had three episodes of emesis that were large.  Pt states that he has also had two episodes of diarrhea since eating.  Pt has renal issues and has been told by PCP to come to ED when excessive emesis or diarrhea has occurred

## 2014-01-30 NOTE — ED Notes (Signed)
Hospitalist at bedside 

## 2014-01-30 NOTE — ED Notes (Signed)
Report given to Control and instrumentation engineer on 5th floor

## 2014-01-30 NOTE — ED Notes (Signed)
Provided pt with urine and he is aware a sample is needed. Will notify when he has voided

## 2014-01-30 NOTE — H&P (Signed)
Seth Estrada is an 64 y.o. male.     Pcp: Inda Merlin Neurosurgery: Allyson Sabal  Chief Complaint: n/v, diarrhea HPI: 64 yo male with hx adrenal insufficiency, apparently felt ill, had n/v, diarrheax3,  Loose stool yesterday and felt diaphoretic.  Pt had increased his hydrocortisone but still felt ill, and therefore presented to ED. Pt was noted to be hypotensive and responded to fluid.  Pt will be observed for adrenal insufficiency.   Past Medical History  Diagnosis Date  . Renal disorder   . Adrenal insufficiency   . Pituitary adenoma     Past Surgical History  Procedure Laterality Date  . Pituitary excision  02/2009    Newman Pies  . Shoulder surgery      bilateral rotator cuff  . Hand surgery    . Hernia repair      Family History  Problem Relation Age of Onset  . CAD Mother   . Colon cancer Father    Social History:  reports that he has never smoked. He has never used smokeless tobacco. He reports that he does not drink alcohol or use illicit drugs.  Allergies: No Known Allergies   (Not in a hospital admission)  Results for orders placed or performed during the hospital encounter of 01/30/14 (from the past 48 hour(s))  CBC with Differential     Status: Abnormal   Collection Time: 01/30/14  2:08 AM  Result Value Ref Range   WBC 10.3 4.0 - 10.5 K/uL   RBC 5.38 4.22 - 5.81 MIL/uL   Hemoglobin 16.9 13.0 - 17.0 g/dL   HCT 50.7 39.0 - 52.0 %   MCV 94.2 78.0 - 100.0 fL   MCH 31.4 26.0 - 34.0 pg   MCHC 33.3 30.0 - 36.0 g/dL   RDW 12.8 11.5 - 15.5 %   Platelets 140 (L) 150 - 400 K/uL   Neutrophils Relative % 88 (H) 43 - 77 %   Neutro Abs 9.0 (H) 1.7 - 7.7 K/uL   Lymphocytes Relative 8 (L) 12 - 46 %   Lymphs Abs 0.9 0.7 - 4.0 K/uL   Monocytes Relative 4 3 - 12 %   Monocytes Absolute 0.4 0.1 - 1.0 K/uL   Eosinophils Relative 0 0 - 5 %   Eosinophils Absolute 0.0 0.0 - 0.7 K/uL   Basophils Relative 0 0 - 1 %   Basophils Absolute 0.0 0.0 - 0.1 K/uL   Comprehensive metabolic panel     Status: Abnormal   Collection Time: 01/30/14  2:08 AM  Result Value Ref Range   Sodium 139 135 - 145 mmol/L    Comment: Please note change in reference range.   Potassium 3.8 3.5 - 5.1 mmol/L    Comment: Please note change in reference range.   Chloride 105 96 - 112 mEq/L   CO2 27 19 - 32 mmol/L   Glucose, Bld 107 (H) 70 - 99 mg/dL   BUN 25 (H) 6 - 23 mg/dL   Creatinine, Ser 1.20 0.50 - 1.35 mg/dL   Calcium 8.9 8.4 - 10.5 mg/dL   Total Protein 7.0 6.0 - 8.3 g/dL   Albumin 4.5 3.5 - 5.2 g/dL   AST 27 0 - 37 U/L   ALT 18 0 - 53 U/L   Alkaline Phosphatase 47 39 - 117 U/L   Total Bilirubin 1.2 0.3 - 1.2 mg/dL   GFR calc non Af Amer 63 (L) >90 mL/min   GFR calc Af Amer 73 (L) >90 mL/min  Comment: (NOTE) The eGFR has been calculated using the CKD EPI equation. This calculation has not been validated in all clinical situations. eGFR's persistently <90 mL/min signify possible Chronic Kidney Disease.    Anion gap 7 5 - 15  Lipase, blood     Status: None   Collection Time: 01/30/14  2:08 AM  Result Value Ref Range   Lipase 36 11 - 59 U/L  Urinalysis, Routine w reflex microscopic     Status: None   Collection Time: 01/30/14  3:15 AM  Result Value Ref Range   Color, Urine YELLOW YELLOW   APPearance CLEAR CLEAR   Specific Gravity, Urine 1.013 1.005 - 1.030   pH 7.0 5.0 - 8.0   Glucose, UA NEGATIVE NEGATIVE mg/dL   Hgb urine dipstick NEGATIVE NEGATIVE   Bilirubin Urine NEGATIVE NEGATIVE   Ketones, ur NEGATIVE NEGATIVE mg/dL   Protein, ur NEGATIVE NEGATIVE mg/dL   Urobilinogen, UA 0.2 0.0 - 1.0 mg/dL   Nitrite NEGATIVE NEGATIVE   Leukocytes, UA NEGATIVE NEGATIVE    Comment: MICROSCOPIC NOT DONE ON URINES WITH NEGATIVE PROTEIN, BLOOD, LEUKOCYTES, NITRITE, OR GLUCOSE <1000 mg/dL.   No results found.  Review of Systems  Constitutional: Negative for fever, chills, weight loss, malaise/fatigue and diaphoresis.  HENT: Negative for congestion, ear  discharge, ear pain, hearing loss, nosebleeds, sore throat and tinnitus.   Eyes: Negative for blurred vision, double vision, photophobia, pain, discharge and redness.  Respiratory: Negative for cough, hemoptysis, sputum production, shortness of breath, wheezing and stridor.   Cardiovascular: Negative for chest pain, palpitations, orthopnea, claudication, leg swelling and PND.  Gastrointestinal: Positive for nausea, vomiting and diarrhea. Negative for heartburn, abdominal pain, constipation, blood in stool and melena.  Genitourinary: Negative for dysuria, urgency, frequency, hematuria and flank pain.  Musculoskeletal: Negative for myalgias, back pain, joint pain, falls and neck pain.  Skin: Negative for itching and rash.  Neurological: Negative for dizziness, tingling, tremors, sensory change, speech change, focal weakness, seizures, loss of consciousness, weakness and headaches.  Endo/Heme/Allergies: Negative for environmental allergies and polydipsia. Does not bruise/bleed easily.  Psychiatric/Behavioral: Negative for depression, suicidal ideas, hallucinations, memory loss and substance abuse. The patient is not nervous/anxious and does not have insomnia.     Blood pressure 132/68, pulse 97, temperature 98.3 F (36.8 C), temperature source Oral, resp. rate 15, height _0  (1.803 m), weight 92.987 kg (205 lb), SpO2 95 %. Physical Exam  Constitutional: He is oriented to person, place, and time. He appears well-developed and well-nourished.  HENT:  Head: Normocephalic and atraumatic.  Eyes: Conjunctivae and EOM are normal. Pupils are equal, round, and reactive to light. No scleral icterus.  Neck: Normal range of motion. Neck supple. No JVD present. No tracheal deviation present. No thyromegaly present.  Cardiovascular: Normal rate and regular rhythm.  Exam reveals no gallop and no friction rub.   No murmur heard. Respiratory: Effort normal and breath sounds normal. No respiratory distress. He  has no wheezes. He has no rales. He exhibits no tenderness.  GI: Soft. Bowel sounds are normal. He exhibits no distension and no mass. There is no tenderness. There is no rebound and no guarding.  Musculoskeletal: Normal range of motion. He exhibits no edema or tenderness.  Lymphadenopathy:    He has no cervical adenopathy.  Neurological: He is alert and oriented to person, place, and time. He has normal reflexes. He displays normal reflexes. No cranial nerve deficit. He exhibits normal muscle tone. Coordination normal.  Skin: Skin is warm and  dry. No rash noted. No erythema. No pallor.  Psychiatric: He has a normal mood and affect. His behavior is normal. Judgment and thought content normal.     Assessment/Plan Hypotension Check cortisol Check trop iq6hx3, consider cardiac echo  Adrenal insufficiency Solumedrol 64m iv at 1300 today Please consult with endocrine as to how they would like to manage his steroids Continue hydrocortisone  Tachycardia Likely seconadry to volume depletion Ns iv  N/v zofran  Diarrhea Check stool for fecal leukocytes, culture, c. Diff Consider immodium if needed.   KJani Gravel1/10/2014, 6:35 AM

## 2014-01-31 LAB — BASIC METABOLIC PANEL
Anion gap: 7 (ref 5–15)
BUN: 15 mg/dL (ref 6–23)
CALCIUM: 7.4 mg/dL — AB (ref 8.4–10.5)
CO2: 25 mmol/L (ref 19–32)
Chloride: 108 mEq/L (ref 96–112)
Creatinine, Ser: 1.04 mg/dL (ref 0.50–1.35)
GFR calc non Af Amer: 74 mL/min — ABNORMAL LOW (ref >90)
GFR, EST AFRICAN AMERICAN: 86 mL/min — AB (ref >90)
GLUCOSE: 117 mg/dL — AB (ref 70–99)
POTASSIUM: 3.5 mmol/L (ref 3.5–5.1)
SODIUM: 140 mmol/L (ref 135–145)

## 2014-01-31 NOTE — Discharge Summary (Signed)
Physician Discharge Summary  Seth Estrada DDU:[id number] DOB: 01-Jan-1951 DOA: 01/30/2014  PCP: Marjorie Smolder, MD  Admit date: 01/30/2014 Discharge date: 01/31/2014  Time spent: 40 minutes  Recommendations for Outpatient Follow-up:  1. Follow-up with primary care physician within one week.  Discharge Diagnoses:  Active Problems:   Adrenal insufficiency   Nausea vomiting and diarrhea   Discharge Condition: Stable  Diet recommendation: Heart healthy  Filed Weights   01/30/14 0119  Weight: 92.987 kg (205 lb)    History of present illness:  A very pleasant 64 yo male with hx adrenal insufficiency, apparently felt ill, had n/v, diarrheax3, Loose stool yesterday and felt diaphoretic. Pt had increased his hydrocortisone but still felt ill, and therefore presented to ED. Pt was noted to be hypotensive and responded to fluid. Pt will be observed for adrenal insufficiency.   Hospital Course:   Nausea vomiting and diarrhea Likely transient viral gastroenteritis, this is already resolved prior to discharge. Nausea resolved since admission, patient did not have bowel movements since admission as well. Diet advanced since yesterday to regular and he is been tolerating it without problems. Patient discharged home to follow-up with primary care physician in one week.  Adrenal insufficiency Placed on a stress dose of Solu-Cortef, 50 mg 3 times a day. Feels much better today, given IV Solu-Cortef this morning. Patient instructed to go back to his home dose of hydrocortisone.  Procedures:  None  Consultations:  None  Discharge Exam: Filed Vitals:   01/31/14 0544  BP: 111/63  Pulse:   Temp: 98.9 F (37.2 C)  Resp: 20   General: Alert and awake, oriented x3, not in any acute distress. HEENT: anicteric sclera, pupils reactive to light and accommodation, EOMI CVS: S1-S2 clear, no murmur rubs or gallops Chest: clear to auscultation bilaterally, no wheezing, rales or  rhonchi Abdomen: soft nontender, nondistended, normal bowel sounds, no organomegaly Extremities: no cyanosis, clubbing or edema noted bilaterally Neuro: Cranial nerves II-XII intact, no focal neurological deficits  Discharge Instructions   Discharge Instructions    Increase activity slowly    Complete by:  As directed           Current Discharge Medication List    CONTINUE these medications which have NOT CHANGED   Details  aspirin EC 81 MG tablet Take 81 mg by mouth daily.    hydrocortisone (CORTEF) 10 MG tablet 15 mg in the morning, 5 mg in the early afternoon, 5 mg in the late afternoon Refills: 4    levothyroxine (SYNTHROID, LEVOTHROID) 50 MCG tablet Take 50 mcg by mouth.    rosuvastatin (CRESTOR) 5 MG tablet Take 5 mg by mouth.    testosterone cypionate (DEPOTESTOTERONE CYPIONATE) 200 MG/ML injection Inject 0.6 mLs into the muscle every 14 (fourteen) days. Refills: 3       No Known Allergies Follow-up Information    Follow up with Marjorie Smolder, MD In 1 week.   Specialty:  Family Medicine   Contact information:   [address] [place] [address]        The results of significant diagnostics from this hospitalization (including imaging, microbiology, ancillary and laboratory) are listed below for reference.    Significant Diagnostic Studies: No results found.  Microbiology: No results found for this or any previous visit (from the past 240 hour(s)).   Labs: Basic Metabolic Panel:  Recent Labs Lab 01/30/14 0208 01/31/14 0523  NA 139 140  K 3.8 3.5  CL 105 108  CO2  27 25  GLUCOSE 107* 117*  BUN 25* 15  CREATININE 1.20 1.04  CALCIUM 8.9 7.4*   Liver Function Tests:  Recent Labs Lab 01/30/14 0208  AST 27  ALT 18  ALKPHOS 47  BILITOT 1.2  PROT 7.0  ALBUMIN 4.5    Recent Labs Lab 01/30/14 0208  LIPASE 36   No results for input(s): AMMONIA in the last 168 hours. CBC:  Recent Labs Lab 01/30/14 0208  WBC  10.3  NEUTROABS 9.0*  HGB 16.9  HCT 50.7  MCV 94.2  PLT 140*   Cardiac Enzymes:  Recent Labs Lab 01/30/14 0710 01/30/14 1254 01/30/14 1856  TROPONINI <0.03 <0.03 <0.03   BNP: BNP (last 3 results) No results for input(s): PROBNP in the last 8760 hours. CBG: No results for input(s): GLUCAP in the last 168 hours.     Signed:  Lamaria Hildebrandt A  [facility] 01/31/2014, 8:59 AM

## 2014-10-07 ENCOUNTER — Other Ambulatory Visit: Payer: Self-pay | Admitting: Neurosurgery

## 2014-10-07 DIAGNOSIS — D352 Benign neoplasm of pituitary gland: Secondary | ICD-10-CM

## 2014-12-14 DIAGNOSIS — M19049 Primary osteoarthritis, unspecified hand: Secondary | ICD-10-CM | POA: Insufficient documentation

## 2014-12-14 DIAGNOSIS — M25842 Other specified joint disorders, left hand: Secondary | ICD-10-CM | POA: Insufficient documentation

## 2015-04-12 ENCOUNTER — Other Ambulatory Visit: Payer: Self-pay | Admitting: Neurosurgery

## 2015-04-12 DIAGNOSIS — D352 Benign neoplasm of pituitary gland: Secondary | ICD-10-CM

## 2015-04-24 ENCOUNTER — Other Ambulatory Visit: Payer: BLUE CROSS/BLUE SHIELD

## 2015-04-25 ENCOUNTER — Ambulatory Visit
Admission: RE | Admit: 2015-04-25 | Discharge: 2015-04-25 | Disposition: A | Discharge status: Home / Self Care | Payer: Medicare Other | Source: Ambulatory Visit | Attending: Neurosurgery | Admitting: Neurosurgery

## 2015-04-25 DIAGNOSIS — D352 Benign neoplasm of pituitary gland: Secondary | ICD-10-CM

## 2015-04-25 MED ORDER — GADOBENATE DIMEGLUMINE 529 MG/ML IV SOLN
10.0000 mL | Freq: Once | INTRAVENOUS | Status: AC | PRN
  Administered 2015-04-25: 10 mL via INTRAVENOUS

## 2015-05-11 ENCOUNTER — Other Ambulatory Visit: Payer: Self-pay | Admitting: Neurosurgery

## 2015-06-06 ENCOUNTER — Other Ambulatory Visit (HOSPITAL_COMMUNITY): Payer: Medicare Other

## 2015-06-07 ENCOUNTER — Other Ambulatory Visit (HOSPITAL_COMMUNITY): Payer: Self-pay | Admitting: *Deleted

## 2015-06-07 NOTE — Pre-Procedure Instructions (Signed)
Seth Estrada  06/07/2015      [id number] - [place], [place] - [address] [place] [address] Phone: [phone] Fax: [phone]    Your procedure is scheduled on Thursday May 25th  Report to [facility] Admitting at 5:30A.M.   Call this number if you have problems the morning of surgery: [phone]  If questions prior to surgery date you may call [phone] between 8 and 4.   Remember:  Do not eat food or drink liquids after midnight.  Take these medicines the morning of surgery with A SIP OF WATER:  synthroid (levothyroxine)  Stop taking aspirin or aspirin containing products, Nsaid anit-inflammatories (including your glucosamine, motrin, ibuprofen, aleve, naproxen, advil) and vitamin and herbal supplements   Do not wear jewelry   Do not wear lotions, powders, or colognes.  You may wear deodorant.   Men may shave face and neck.    Do not bring valuables to the hospital.   [facility] is not responsible for any belongings or valuables.  Contacts, dentures or bridgework may not be worn into surgery.  Leave your suitcase in the car.  After surgery it may be brought to your room.  Special instructions: Special Instructions:  - Preparing for Surgery  Before surgery, you can play an important role.  Because skin is not sterile, your skin needs to be as free of germs as possible.  You can reduce the number of germs on you skin by washing with CHG (chlorahexidine gluconate) soap before surgery.  CHG is an antiseptic cleaner which kills germs and bonds with the skin to continue killing germs even after washing.  Please DO NOT use if you have an allergy to CHG or antibacterial soaps.  If your skin becomes reddened/irritated stop using the CHG and inform your nurse when you arrive at Short Stay.  Do not shave (including legs and underarms) for at least 48 hours prior to the first  CHG shower.  You may shave your face.  Please follow these instructions carefully:   1.  Shower with CHG Soap the night before surgery and the  morning of Surgery.  2.  If you choose to wash your hair, wash your hair first as usual with your  normal shampoo.  3.  After you shampoo, rinse your hair and body thoroughly to remove the  Shampoo.  4.  Use CHG as you would any other liquid soap.  You can apply chg directly to the skin and wash gently with scrungie or a clean washcloth.  5.  Apply the CHG Soap to your body ONLY FROM THE NECK DOWN.    Do not use on open wounds or open sores.  Avoid contact with your eyes, ears, mouth and genitals (private parts).  Wash genitals (private parts)   with your normal soap.  6.  Wash thoroughly, paying special attention to the area where your surgery will be performed.  7.  Thoroughly rinse your body with warm water from the neck down.  8.  DO NOT shower/wash with your normal soap after using and rinsing off   the CHG Soap.  9.  Pat yourself dry with a clean towel.            10.  Wear clean pajamas.            11.  Place clean sheets on your bed the night of your first shower  and do not sleep with pets.  Day of Surgery  Do not apply any lotions/deodorants the morning of surgery.  Please wear clean clothes to the hospital/surgery center.Shower with CHG the night before and morning of surgery  Please read over the following fact sheets that you were given. Blood Transfusion Information and MRSA Information

## 2015-06-08 ENCOUNTER — Encounter (HOSPITAL_COMMUNITY)
Admission: RE | Admit: 2015-06-08 | Discharge: 2015-06-08 | Disposition: A | Discharge status: Home / Self Care | Payer: Medicare Other | Source: Ambulatory Visit | Attending: Neurosurgery | Admitting: Neurosurgery

## 2015-06-08 ENCOUNTER — Encounter (HOSPITAL_COMMUNITY): Payer: Self-pay

## 2015-06-08 DIAGNOSIS — Z01812 Encounter for preprocedural laboratory examination: Secondary | ICD-10-CM | POA: Diagnosis not present

## 2015-06-08 DIAGNOSIS — E237 Disorder of pituitary gland, unspecified: Secondary | ICD-10-CM | POA: Diagnosis not present

## 2015-06-08 HISTORY — DX: Hypothyroidism, unspecified: E03.9

## 2015-06-08 HISTORY — DX: Other seasonal allergic rhinitis: J30.2

## 2015-06-08 HISTORY — DX: Unspecified osteoarthritis, unspecified site: M19.90

## 2015-06-08 LAB — BASIC METABOLIC PANEL
ANION GAP: 9 (ref 5–15)
BUN: 22 mg/dL — AB (ref 6–20)
CO2: 25 mmol/L (ref 22–32)
Calcium: 9.2 mg/dL (ref 8.9–10.3)
Chloride: 103 mmol/L (ref 101–111)
Creatinine, Ser: 1.15 mg/dL (ref 0.61–1.24)
Glucose, Bld: 97 mg/dL (ref 65–99)
POTASSIUM: 4.4 mmol/L (ref 3.5–5.1)
SODIUM: 137 mmol/L (ref 135–145)

## 2015-06-08 LAB — CBC
HEMATOCRIT: 47.8 % (ref 39.0–52.0)
Hemoglobin: 16.1 g/dL (ref 13.0–17.0)
MCH: 31.6 pg (ref 26.0–34.0)
MCHC: 33.7 g/dL (ref 30.0–36.0)
MCV: 93.7 fL (ref 78.0–100.0)
Platelets: 155 10*3/uL (ref 150–400)
RBC: 5.1 MIL/uL (ref 4.22–5.81)
RDW: 12.9 % (ref 11.5–15.5)
WBC: 6.9 10*3/uL (ref 4.0–10.5)

## 2015-06-08 LAB — TYPE AND SCREEN
ABO/RH(D): O POS
ANTIBODY SCREEN: NEGATIVE

## 2015-06-08 NOTE — Progress Notes (Signed)
Pt. Reports that in the very distant past he had a treadmill stress test. Pt. Denies all  chest concerns.  Pt. Followed by Brayton Layman at [facility] for PCP  & Dr. Buddy Duty for Endocrine.

## 2015-06-15 ENCOUNTER — Inpatient Hospital Stay (HOSPITAL_COMMUNITY): Payer: Medicare Other

## 2015-06-15 ENCOUNTER — Encounter (HOSPITAL_COMMUNITY): Payer: Self-pay | Admitting: General Practice

## 2015-06-15 ENCOUNTER — Encounter (HOSPITAL_COMMUNITY)
Admission: RE | Disposition: A | Discharge status: Home / Self Care | Payer: Self-pay | Source: Ambulatory Visit | Attending: Neurosurgery

## 2015-06-15 ENCOUNTER — Inpatient Hospital Stay (HOSPITAL_COMMUNITY): Payer: Medicare Other | Admitting: Anesthesiology

## 2015-06-15 ENCOUNTER — Inpatient Hospital Stay (HOSPITAL_COMMUNITY)
Admission: RE | Admit: 2015-06-15 | Discharge: 2015-06-17 | DRG: 623 | Disposition: A | Discharge status: Home / Self Care | Payer: Medicare Other | Source: Ambulatory Visit | Attending: Neurosurgery | Admitting: Neurosurgery

## 2015-06-15 DIAGNOSIS — K219 Gastro-esophageal reflux disease without esophagitis: Secondary | ICD-10-CM | POA: Diagnosis present

## 2015-06-15 DIAGNOSIS — D352 Benign neoplasm of pituitary gland: Secondary | ICD-10-CM | POA: Diagnosis present

## 2015-06-15 DIAGNOSIS — Z7982 Long term (current) use of aspirin: Secondary | ICD-10-CM

## 2015-06-15 DIAGNOSIS — Z419 Encounter for procedure for purposes other than remedying health state, unspecified: Secondary | ICD-10-CM

## 2015-06-15 DIAGNOSIS — I959 Hypotension, unspecified: Secondary | ICD-10-CM | POA: Diagnosis not present

## 2015-06-15 DIAGNOSIS — Z79899 Other long term (current) drug therapy: Secondary | ICD-10-CM

## 2015-06-15 DIAGNOSIS — M1990 Unspecified osteoarthritis, unspecified site: Secondary | ICD-10-CM | POA: Diagnosis present

## 2015-06-15 DIAGNOSIS — E039 Hypothyroidism, unspecified: Secondary | ICD-10-CM | POA: Diagnosis present

## 2015-06-15 DIAGNOSIS — R0902 Hypoxemia: Secondary | ICD-10-CM | POA: Diagnosis not present

## 2015-06-15 DIAGNOSIS — R001 Bradycardia, unspecified: Secondary | ICD-10-CM | POA: Diagnosis not present

## 2015-06-15 DIAGNOSIS — E2740 Unspecified adrenocortical insufficiency: Secondary | ICD-10-CM | POA: Diagnosis present

## 2015-06-15 HISTORY — PX: TRANSNASAL APPROACH: SHX6149

## 2015-06-15 HISTORY — PX: TURBINATE REDUCTION: SHX6157

## 2015-06-15 HISTORY — PX: CRANIOTOMY: SHX93

## 2015-06-15 SURGERY — CRANIOTOMY HYPOPHYSECTOMY TRANSNASAL APPROACH
Anesthesia: General | Site: Nose

## 2015-06-15 MED ORDER — PANTOPRAZOLE SODIUM 40 MG IV SOLR
40.0000 mg | Freq: Every day | INTRAVENOUS | Status: DC
  Administered 2015-06-15 – 2015-06-16 (×2): 40 mg via INTRAVENOUS
  Filled 2015-06-15 (×2): qty 40

## 2015-06-15 MED ORDER — METOCLOPRAMIDE HCL 5 MG/ML IJ SOLN: 10.0000 mg | Freq: Once | INTRAMUSCULAR | Status: DC | PRN

## 2015-06-15 MED ORDER — OXYMETAZOLINE HCL 0.05 % NA SOLN
NASAL | Status: DC | PRN
  Administered 2015-06-15 (×2): 1 via TOPICAL

## 2015-06-15 MED ORDER — LIDOCAINE 2% (20 MG/ML) 5 ML SYRINGE
INTRAMUSCULAR | Status: AC
  Filled 2015-06-15: qty 5

## 2015-06-15 MED ORDER — SODIUM CHLORIDE 0.9 % IV SOLN
0.1500 ug/kg/min | INTRAVENOUS | Status: AC
  Administered 2015-06-15: .05 ug/kg/min via INTRAVENOUS
  Filled 2015-06-15: qty 2000

## 2015-06-15 MED ORDER — CEFAZOLIN SODIUM-DEXTROSE 2-4 GM/100ML-% IV SOLN
2.0000 g | Freq: Four times a day (QID) | INTRAVENOUS | Status: DC
  Administered 2015-06-15 – 2015-06-17 (×7): 2 g via INTRAVENOUS
  Filled 2015-06-15 (×9): qty 100

## 2015-06-15 MED ORDER — FENTANYL CITRATE (PF) 100 MCG/2ML IJ SOLN: 25.0000 ug | INTRAMUSCULAR | Status: DC | PRN

## 2015-06-15 MED ORDER — MEPERIDINE HCL 25 MG/ML IJ SOLN: 6.2500 mg | INTRAMUSCULAR | Status: DC | PRN

## 2015-06-15 MED ORDER — ONDANSETRON HCL 4 MG/2ML IJ SOLN
INTRAMUSCULAR | Status: DC | PRN
  Administered 2015-06-15: 4 mg via INTRAVENOUS

## 2015-06-15 MED ORDER — LACTATED RINGERS IV SOLN
INTRAVENOUS | Status: DC | PRN
  Administered 2015-06-15: 11:00:00 via INTRAVENOUS

## 2015-06-15 MED ORDER — ROCURONIUM BROMIDE 50 MG/5ML IV SOLN
INTRAVENOUS | Status: AC
  Filled 2015-06-15: qty 1

## 2015-06-15 MED ORDER — ARTIFICIAL TEARS OP OINT
TOPICAL_OINTMENT | OPHTHALMIC | Status: AC
  Filled 2015-06-15: qty 3.5

## 2015-06-15 MED ORDER — LORATADINE 10 MG PO TABS
10.0000 mg | ORAL_TABLET | Freq: Every day | ORAL | Status: DC
  Administered 2015-06-16: 10 mg via ORAL
  Filled 2015-06-15 (×2): qty 1

## 2015-06-15 MED ORDER — ROSUVASTATIN CALCIUM 5 MG PO TABS
5.0000 mg | ORAL_TABLET | Freq: Every day | ORAL | Status: DC
  Administered 2015-06-15 – 2015-06-16 (×2): 5 mg via ORAL
  Filled 2015-06-15 (×2): qty 1

## 2015-06-15 MED ORDER — ONDANSETRON HCL 4 MG/2ML IJ SOLN
4.0000 mg | INTRAMUSCULAR | Status: DC | PRN
  Filled 2015-06-15: qty 2

## 2015-06-15 MED ORDER — HYDROCODONE-ACETAMINOPHEN 5-325 MG PO TABS
1.0000 | ORAL_TABLET | ORAL | Status: DC | PRN
  Administered 2015-06-15 – 2015-06-17 (×3): 1 via ORAL
  Filled 2015-06-15 (×3): qty 1

## 2015-06-15 MED ORDER — SUGAMMADEX SODIUM 200 MG/2ML IV SOLN
INTRAVENOUS | Status: DC | PRN
  Administered 2015-06-15: 200 mg via INTRAVENOUS

## 2015-06-15 MED ORDER — BACITRACIN ZINC 500 UNIT/GM EX OINT
TOPICAL_OINTMENT | CUTANEOUS | Status: DC | PRN
  Administered 2015-06-15: 1 via TOPICAL

## 2015-06-15 MED ORDER — POTASSIUM CHLORIDE IN NACL 20-0.9 MEQ/L-% IV SOLN
INTRAVENOUS | Status: DC
  Administered 2015-06-15 – 2015-06-16 (×2): via INTRAVENOUS
  Filled 2015-06-15 (×6): qty 1000

## 2015-06-15 MED ORDER — LABETALOL HCL 5 MG/ML IV SOLN
INTRAVENOUS | Status: AC
  Filled 2015-06-15: qty 4

## 2015-06-15 MED ORDER — ONDANSETRON HCL 4 MG/2ML IJ SOLN
INTRAMUSCULAR | Status: AC
  Filled 2015-06-15: qty 2

## 2015-06-15 MED ORDER — ACETAMINOPHEN 325 MG PO TABS: 650.0000 mg | ORAL_TABLET | ORAL | Status: DC | PRN

## 2015-06-15 MED ORDER — LIDOCAINE-EPINEPHRINE 1 %-1:100000 IJ SOLN
INTRAMUSCULAR | Status: DC | PRN
  Administered 2015-06-15: 10 mL

## 2015-06-15 MED ORDER — PROPOFOL 10 MG/ML IV BOLUS
INTRAVENOUS | Status: AC
  Filled 2015-06-15: qty 20

## 2015-06-15 MED ORDER — LABETALOL HCL 5 MG/ML IV SOLN
10.0000 mg | INTRAVENOUS | Status: DC | PRN
Start: 2015-06-15 — End: 2015-06-17
  Administered 2015-06-15: 10 mg via INTRAVENOUS

## 2015-06-15 MED ORDER — TESTOSTERONE CYPIONATE 200 MG/ML IM SOLN: 120.0000 mg | INTRAMUSCULAR | Status: DC

## 2015-06-15 MED ORDER — SODIUM CHLORIDE 0.9 % IV SOLN
500.0000 mg | Freq: Two times a day (BID) | INTRAVENOUS | Status: DC
  Administered 2015-06-15 – 2015-06-17 (×4): 500 mg via INTRAVENOUS
  Filled 2015-06-15 (×5): qty 5

## 2015-06-15 MED ORDER — FENTANYL CITRATE (PF) 250 MCG/5ML IJ SOLN
INTRAMUSCULAR | Status: AC
  Filled 2015-06-15: qty 5

## 2015-06-15 MED ORDER — DEXAMETHASONE SODIUM PHOSPHATE 10 MG/ML IJ SOLN
INTRAMUSCULAR | Status: AC
  Filled 2015-06-15: qty 1

## 2015-06-15 MED ORDER — THROMBIN 5000 UNITS EX SOLR
OROMUCOSAL | Status: DC | PRN
  Administered 2015-06-15: 5 mL via TOPICAL

## 2015-06-15 MED ORDER — ACETAMINOPHEN 650 MG RE SUPP
650.0000 mg | RECTAL | Status: DC | PRN
Start: 2015-06-15 — End: 2015-06-17

## 2015-06-15 MED ORDER — HYDROCORTISONE NA SUCCINATE PF 100 MG IJ SOLR
100.0000 mg | INTRAMUSCULAR | Status: AC
  Administered 2015-06-15: 100 mg via INTRAVENOUS
  Filled 2015-06-15: qty 2

## 2015-06-15 MED ORDER — MIDAZOLAM HCL 2 MG/2ML IJ SOLN
INTRAMUSCULAR | Status: AC
  Filled 2015-06-15: qty 2

## 2015-06-15 MED ORDER — PROMETHAZINE HCL 25 MG PO TABS
12.5000 mg | ORAL_TABLET | ORAL | Status: DC | PRN
Start: 2015-06-15 — End: 2015-06-17

## 2015-06-15 MED ORDER — FLUTICASONE PROPIONATE 50 MCG/ACT NA SUSP
1.0000 | Freq: Every day | NASAL | Status: DC
  Filled 2015-06-15: qty 16

## 2015-06-15 MED ORDER — SODIUM CHLORIDE 0.9 % IV SOLN
INTRAVENOUS | Status: DC | PRN
  Administered 2015-06-15 (×2): via INTRAVENOUS

## 2015-06-15 MED ORDER — ROCURONIUM BROMIDE 100 MG/10ML IV SOLN
INTRAVENOUS | Status: DC | PRN
  Administered 2015-06-15: 20 mg via INTRAVENOUS

## 2015-06-15 MED ORDER — BISACODYL 10 MG RE SUPP: 10.0000 mg | Freq: Every day | RECTAL | Status: DC | PRN

## 2015-06-15 MED ORDER — 0.9 % SODIUM CHLORIDE (POUR BTL) OPTIME
TOPICAL | Status: DC | PRN
  Administered 2015-06-15: 1000 mL

## 2015-06-15 MED ORDER — ONDANSETRON HCL 4 MG PO TABS
4.0000 mg | ORAL_TABLET | ORAL | Status: DC | PRN
  Filled 2015-06-15: qty 1

## 2015-06-15 MED ORDER — DOCUSATE SODIUM 100 MG PO CAPS
100.0000 mg | ORAL_CAPSULE | Freq: Two times a day (BID) | ORAL | Status: DC
  Administered 2015-06-15 – 2015-06-16 (×3): 100 mg via ORAL
  Filled 2015-06-15 (×3): qty 1

## 2015-06-15 MED ORDER — LEVOTHYROXINE SODIUM 50 MCG PO TABS
50.0000 ug | ORAL_TABLET | Freq: Every day | ORAL | Status: DC
  Administered 2015-06-16 – 2015-06-17 (×2): 50 ug via ORAL
  Filled 2015-06-15 (×2): qty 1

## 2015-06-15 MED ORDER — CEFAZOLIN SODIUM-DEXTROSE 2-4 GM/100ML-% IV SOLN
INTRAVENOUS | Status: AC
  Filled 2015-06-15: qty 100

## 2015-06-15 MED ORDER — MORPHINE SULFATE (PF) 2 MG/ML IV SOLN
1.0000 mg | INTRAVENOUS | Status: DC | PRN
  Administered 2015-06-15 – 2015-06-16 (×2): 2 mg via INTRAVENOUS
  Filled 2015-06-15 (×2): qty 1

## 2015-06-15 MED ORDER — HYDROCORTISONE NA SUCCINATE PF 100 MG IJ SOLR
100.0000 mg | Freq: Three times a day (TID) | INTRAMUSCULAR | Status: AC
  Administered 2015-06-15 – 2015-06-17 (×5): 100 mg via INTRAVENOUS
  Filled 2015-06-15 (×5): qty 2

## 2015-06-15 MED ORDER — CEFAZOLIN SODIUM-DEXTROSE 2-4 GM/100ML-% IV SOLN: 2.0000 g | INTRAVENOUS | Status: DC

## 2015-06-15 SURGICAL SUPPLY — 132 items
APL SKNCLS STERI-STRIP NONHPOA (GAUZE/BANDAGES/DRESSINGS) ×2
BALL CTTN LRG ABS STRL LF (GAUZE/BANDAGES/DRESSINGS)
BENZOIN TINCTURE PRP APPL 2/3 (GAUZE/BANDAGES/DRESSINGS) ×4 IMPLANT
BLADE EYE SICKLE 84 5 BEAV (BLADE) IMPLANT
BLADE EYE SICKLE 84 5MM BEAV (BLADE)
BLADE SURG 10 STRL SS (BLADE) ×4 IMPLANT
BLADE SURG 11 STRL SS (BLADE) ×6 IMPLANT
BLADE SURG 15 STRL LF DISP TIS (BLADE) ×2 IMPLANT
BLADE SURG 15 STRL SS (BLADE) ×12
BUR MATCHSTICK NEURO 3.0 LAGG (BURR) IMPLANT
CANISTER SUCT 3000ML PPV (MISCELLANEOUS) ×6 IMPLANT
CATH ROBINSON RED A/P 14FR (CATHETERS) IMPLANT
CLEANER TIP ELECTROSURG 2X2 (MISCELLANEOUS) IMPLANT
CLOSURE WOUND 1/2 X4 (GAUZE/BANDAGES/DRESSINGS) ×1
CLOSURE WOUND 1/4X4 (GAUZE/BANDAGES/DRESSINGS)
COAGULATOR SUCT 8FR VV (MISCELLANEOUS) ×2 IMPLANT
CONT SPEC STER OR (MISCELLANEOUS) ×2 IMPLANT
CORDS BIPOLAR (ELECTRODE) ×4 IMPLANT
COTTONBALL LRG STERILE PKG (GAUZE/BANDAGES/DRESSINGS) IMPLANT
COVER MAYO STAND STRL (DRAPES) ×2 IMPLANT
COVER SURGICAL LIGHT HANDLE (MISCELLANEOUS) ×2 IMPLANT
DECANTER SPIKE VIAL GLASS SM (MISCELLANEOUS) ×4 IMPLANT
DEPRESSOR TONGUE BLADE STERILE (MISCELLANEOUS) ×2 IMPLANT
DRAIN SUBARACHNOID (WOUND CARE) IMPLANT
DRAPE EENT ADH APERT 15X15 STR (DRAPES) ×2 IMPLANT
DRAPE INCISE IOBAN 66X45 STRL (DRAPES) ×4 IMPLANT
DRAPE MICROSCOPE LEICA (MISCELLANEOUS) ×4 IMPLANT
DRAPE POUCH INSTRU U-SHP 10X18 (DRAPES) ×4 IMPLANT
DRAPE PROXIMA HALF (DRAPES) ×12 IMPLANT
DRESSING NASAL POPE 10X1.5X2.5 (GAUZE/BANDAGES/DRESSINGS) ×2 IMPLANT
DRSG NASAL POPE 10X1.5X2.5 (GAUZE/BANDAGES/DRESSINGS) ×8
ELECT CAUTERY BLADE 6.4 (BLADE) ×2 IMPLANT
ELECT COATED BLADE 2.86 ST (ELECTRODE) ×4 IMPLANT
ELECT NDL TIP 2.8 STRL (NEEDLE) ×2 IMPLANT
ELECT NEEDLE TIP 2.8 STRL (NEEDLE) ×4 IMPLANT
ELECT REM PT RETURN 9FT ADLT (ELECTROSURGICAL) ×4
ELECTRODE REM PT RTRN 9FT ADLT (ELECTROSURGICAL) ×2 IMPLANT
GAUZE PACKING FOLDED 1IN STRL (GAUZE/BANDAGES/DRESSINGS) IMPLANT
GAUZE PACKING FOLDED 2  STR (GAUZE/BANDAGES/DRESSINGS)
GAUZE PACKING FOLDED 2 STR (GAUZE/BANDAGES/DRESSINGS) ×2 IMPLANT
GAUZE SPONGE 2X2 8PLY STRL LF (GAUZE/BANDAGES/DRESSINGS) ×2 IMPLANT
GAUZE SPONGE 4X4 12PLY STRL (GAUZE/BANDAGES/DRESSINGS) ×4 IMPLANT
GLOVE BIO SURGEON STRL SZ 6.5 (GLOVE) IMPLANT
GLOVE BIO SURGEON STRL SZ7 (GLOVE) IMPLANT
GLOVE BIO SURGEON STRL SZ7.5 (GLOVE) ×4 IMPLANT
GLOVE BIO SURGEON STRL SZ8 (GLOVE) ×4 IMPLANT
GLOVE BIO SURGEON STRL SZ8.5 (GLOVE) ×2 IMPLANT
GLOVE BIO SURGEONS STRL SZ 6.5 (GLOVE)
GLOVE BIOGEL M 8.0 STRL (GLOVE) IMPLANT
GLOVE BIOGEL PI IND STRL 6.5 (GLOVE) IMPLANT
GLOVE BIOGEL PI IND STRL 8 (GLOVE) IMPLANT
GLOVE BIOGEL PI INDICATOR 6.5 (GLOVE) ×2
GLOVE BIOGEL PI INDICATOR 8 (GLOVE) ×2
GLOVE ECLIPSE 6.5 STRL STRAW (GLOVE) ×4 IMPLANT
GLOVE ECLIPSE 7.0 STRL STRAW (GLOVE) IMPLANT
GLOVE ECLIPSE 7.5 STRL STRAW (GLOVE) ×6 IMPLANT
GLOVE ECLIPSE 8.0 STRL XLNG CF (GLOVE) IMPLANT
GLOVE ECLIPSE 8.5 STRL (GLOVE) IMPLANT
GLOVE EXAM NITRILE LRG STRL (GLOVE) IMPLANT
GLOVE EXAM NITRILE MD LF STRL (GLOVE) IMPLANT
GLOVE EXAM NITRILE XL STR (GLOVE) IMPLANT
GLOVE EXAM NITRILE XS STR PU (GLOVE) IMPLANT
GLOVE INDICATOR 6.5 STRL GRN (GLOVE) IMPLANT
GLOVE INDICATOR 7.0 STRL GRN (GLOVE) IMPLANT
GLOVE INDICATOR 7.5 STRL GRN (GLOVE) IMPLANT
GLOVE INDICATOR 8.0 STRL GRN (GLOVE) ×2 IMPLANT
GLOVE INDICATOR 8.5 STRL (GLOVE) IMPLANT
GLOVE OPTIFIT SS 7.0 STRL BRWN (GLOVE)
GLOVE OPTIFIT SS 7.5 STRL LX (GLOVE) ×2 IMPLANT
GLOVE SS BIOGEL STRL SZ 7.5 (GLOVE) ×2 IMPLANT
GLOVE SUPERSENSE BIOGEL SZ 7.5 (GLOVE)
GLOVE SURG SS PI 6.5 STRL IVOR (GLOVE) ×2 IMPLANT
GOWN PREVENTION PLUS XLARGE (GOWN DISPOSABLE) ×2 IMPLANT
GOWN STRL NON-REIN LRG LVL3 (GOWN DISPOSABLE) ×2 IMPLANT
GOWN STRL REIN 2XL LVL4 (GOWN DISPOSABLE) ×2 IMPLANT
GOWN STRL REUS W/ TWL LRG LVL3 (GOWN DISPOSABLE) ×2 IMPLANT
GOWN STRL REUS W/ TWL XL LVL3 (GOWN DISPOSABLE) IMPLANT
GOWN STRL REUS W/TWL LRG LVL3 (GOWN DISPOSABLE) ×12
GOWN STRL REUS W/TWL XL LVL3 (GOWN DISPOSABLE) ×4
KIT BASIN OR (CUSTOM PROCEDURE TRAY) ×6 IMPLANT
KIT ROOM TURNOVER OR (KITS) ×6 IMPLANT
MARKER SKIN DUAL TIP RULER LAB (MISCELLANEOUS) ×6 IMPLANT
NDL HYPO 25X1 1.5 SAFETY (NEEDLE) ×2 IMPLANT
NDL PRECISIONGLIDE 27X1.5 (NEEDLE) ×2 IMPLANT
NDL SPNL 22GX3.5 QUINCKE BK (NEEDLE) IMPLANT
NEEDLE HYPO 25X1 1.5 SAFETY (NEEDLE) ×4 IMPLANT
NEEDLE PRECISIONGLIDE 27X1.5 (NEEDLE) ×4 IMPLANT
NEEDLE SPNL 22GX3.5 QUINCKE BK (NEEDLE) IMPLANT
NS IRRIG 1000ML POUR BTL (IV SOLUTION) ×6 IMPLANT
PAD ARMBOARD 7.5X6 YLW CONV (MISCELLANEOUS) ×12 IMPLANT
PATTIES SURGICAL .25X.25 (GAUZE/BANDAGES/DRESSINGS) ×4 IMPLANT
PATTIES SURGICAL .5 X.5 (GAUZE/BANDAGES/DRESSINGS) ×2 IMPLANT
PATTIES SURGICAL .5 X3 (DISPOSABLE) ×6 IMPLANT
PENCIL BUTTON HOLSTER BLD 10FT (ELECTRODE) ×4 IMPLANT
PENCIL FOOT CONTROL (ELECTRODE) ×2 IMPLANT
PIN MAYFIELD SKULL DISP (PIN) ×4 IMPLANT
RUBBERBAND STERILE (MISCELLANEOUS) ×8 IMPLANT
SHEET SIL 040 (INSTRUMENTS) IMPLANT
SPLINT NASAL DOYLE BI-VL (GAUZE/BANDAGES/DRESSINGS) ×2 IMPLANT
SPONGE GAUZE 2X2 STER 10/PKG (GAUZE/BANDAGES/DRESSINGS) ×2
SPONGE LAP 4X18 X RAY DECT (DISPOSABLE) ×4 IMPLANT
STAPLER SKIN PROX WIDE 3.9 (STAPLE) ×4 IMPLANT
STRIP CLOSURE SKIN 1/2X4 (GAUZE/BANDAGES/DRESSINGS) ×1 IMPLANT
STRIP CLOSURE SKIN 1/4X4 (GAUZE/BANDAGES/DRESSINGS) IMPLANT
SUT BONE WAX W31G (SUTURE) ×4 IMPLANT
SUT CHROMIC 3 0 PS 2 (SUTURE) IMPLANT
SUT CHROMIC 4 0 P 3 18 (SUTURE) ×4 IMPLANT
SUT CHROMIC 4 0 PS 2 18 (SUTURE) ×4 IMPLANT
SUT ETHILON 3 0 PS 1 (SUTURE) ×2 IMPLANT
SUT ETHILON 4 0 PS 2 18 (SUTURE) ×2 IMPLANT
SUT ETHILON 6 0 P 1 (SUTURE) IMPLANT
SUT NOVAFIL 6 0 PRE 2 4412 13 (SUTURE) IMPLANT
SUT PLAIN 4 0 ~~LOC~~ 1 (SUTURE) IMPLANT
SUT PROLENE 6 0 BV (SUTURE) IMPLANT
SUT SILK 2 0 FS (SUTURE) ×4 IMPLANT
SUT VIC AB 2-0 CP2 18 (SUTURE) ×2 IMPLANT
SUT VIC AB 2-0 CT1 27 (SUTURE)
SUT VIC AB 2-0 CT1 27XBRD (SUTURE) IMPLANT
SUT VIC AB 2-0 CT2 18 VCP726D (SUTURE) IMPLANT
SUT VIC AB 3-0 SH 8-18 (SUTURE) IMPLANT
SUT VIC AB 4-0 P-3 18X BRD (SUTURE) IMPLANT
SUT VIC AB 4-0 P3 18 (SUTURE)
SYR 5ML LL (SYRINGE) IMPLANT
SYR CONTROL 10ML LL (SYRINGE) ×4 IMPLANT
SYR TB 1ML 25GX5/8 (SYRINGE) IMPLANT
TAPE CLOTH SURG 4X10 WHT LF (GAUZE/BANDAGES/DRESSINGS) ×2 IMPLANT
TOWEL OR 17X24 6PK STRL BLUE (TOWEL DISPOSABLE) ×4 IMPLANT
TOWEL OR 17X26 10 PK STRL BLUE (TOWEL DISPOSABLE) ×4 IMPLANT
TRAY ENT MC OR (CUSTOM PROCEDURE TRAY) ×6 IMPLANT
TRAY FOLEY W/METER SILVER 16FR (SET/KITS/TRAYS/PACK) ×4 IMPLANT
UNDERPAD 30X30 INCONTINENT (UNDERPADS AND DIAPERS) ×2 IMPLANT
WATER STERILE IRR 1000ML POUR (IV SOLUTION) ×8 IMPLANT

## 2015-06-15 NOTE — Progress Notes (Signed)
Subjective:  The patient is alert and pleasant. He is in no apparent distress. He had an episode of bradycardia, hypoxia and hypotension. He denies cardiac problems. He denies chest pain. The  Objective: Vital signs in last 24 hours: Temp:  [97.3 F (36.3 C)-98.9 F (37.2 C)] 97.9 F (36.6 C) (05/25 1425) Pulse Rate:  [57-80] 57 (05/25 1545) Resp:  [9-30] 14 (05/25 1545) BP: (68-150)/(41-96) 68/43 mmHg (05/25 1545) SpO2:  [93 %-98 %] 97 % (05/25 1545) Arterial Line BP: (57-182)/(29-117) 57/29 mmHg (05/25 1545) Weight:  [92.401 kg (203 lb 11.3 oz)] 92.401 kg (203 lb 11.3 oz) (05/25 0610)  Intake/Output from previous day:   Intake/Output this shift: Total I/O In: 1375 [I.V.:1375] Out: 2325 [Urine:2325]  Physical exam the patient is alert and pleasant. He is oriented 3. His vision is normal. Speech is normal. He is moving all 4 extremities well.  Lab Results: No results for input(s): WBC, HGB, HCT, PLT in the last 72 hours. BMET No results for input(s): NA, K, CL, CO2, GLUCOSE, BUN, CREATININE, CALCIUM in the last 72 hours.  Studies/Results: Dg Skull 1-3 Views  06/15/2015  CLINICAL DATA:  Transsphenoidal excision of pituitary lesion EXAM: SKULL - 1-3 VIEW COMPARISON:  MR brain of 04/25/2015 FINDINGS: A single C-arm spot film was returned showing an instrument overlying the enlarged sella turcica in the lateral view. IMPRESSION: Instrument overlies the enlarged sella turcica on the lateral view. Electronically Signed   By: Ivar Drape M.D.   On: 06/15/2015 10:46   Dg C-arm 1-60 Min  06/15/2015  CLINICAL DATA:  A excision of pituitary mass EXAM: DG C-ARM 61-120 MIN COMPARISON:  MR brain of 04/25/2015 FINDINGS: C-arm fluoroscopy was provided for localization of the enlarged sella turcica. Fluoroscopy time of 20.4 seconds is noted. IMPRESSION: C-arm fluoroscopy provided. Electronically Signed   By: Ivar Drape M.D.   On: 06/15/2015 10:46    Assessment/Plan: Neurologically the patient  is doing well. We will check an EKG. Perhaps he got a bit vagal.  LOS: 0 days     Zaiden Ludlum D 06/15/2015, 3:57 PM

## 2015-06-15 NOTE — Anesthesia Postprocedure Evaluation (Signed)
Anesthesia Post Note  Patient: Seth Estrada  Procedure(s) Performed: Procedure(s) (LRB): CRANIOTOMY HYPOPHYSECTOMY TRANSNASAL APPROACH Transphenoidal resection of pituitary tumor  (N/A) TRANSNASAL APPROACH (N/A) BILATERAL TURBINATE REDUCTION (Bilateral)  Patient location during evaluation: PACU Anesthesia Type: General Level of consciousness: awake and alert Pain management: pain level controlled Vital Signs Assessment: post-procedure vital signs reviewed and stable Respiratory status: spontaneous breathing, nonlabored ventilation, respiratory function stable and patient connected to nasal cannula oxygen Cardiovascular status: blood pressure returned to baseline and stable Postop Assessment: no signs of nausea or vomiting Anesthetic complications: no    Last Vitals:  Filed Vitals:   06/15/15 1446 06/15/15 1500  BP: 136/89 139/91  Pulse:  71  Temp:    Resp:  12    Last Pain:  Filed Vitals:   06/15/15 1502  PainSc: 4                  Montez Hageman

## 2015-06-15 NOTE — Progress Notes (Signed)
Post Op Check Awake alert no real complaints Apparently got a little vasovagal earlier today with lower O2 sat and started on FS O2 Sats presently 96 % No airway problems and feeling better Minimal bleeding from nose Stable post op

## 2015-06-15 NOTE — Op Note (Signed)
Brief history: The patient is a 65 year old white male on whom Dr. Lucia Gaskins and I previously performed a transsphenoidal resection of a pituitary adenoma. The patient did well after that surgery but has developed a recurrent pituitary adenoma noted on serial surveillance MRIs. I discussed the situation with the patient and his wife. We discussed the various treatment options including surgery. He has weighed the risks, benefits, and alternative surgery decided to proceed with a redo transsphenoidal resection of his pituitary adenoma.  Preop diagnosis: Recurrent pituitary adenoma  Postop diagnosis: The same  Procedure: Redo transsphenoidal resection of pituitary adenoma using microdissection; harvesting of the abdominal fat graft through a separate incision.  Surgeon: Dr. Earle Gell  Assistant: Dr. Sherley Bounds  Anesthesia: Gen. endotracheal  Estimated blood loss: Minimal  Specimens: Tumor  Drains: None  Complications: None  Description of procedure: The patient was brought to the operating room by the anesthesia team. General endotracheal anesthesia was induced. I applied the [place] head rest to the patient's calvarium. Dr. Lucia Gaskins and I position the patient.  At this point Dr. Lucia Gaskins provided the exposure to the sphenoid sinus. For further details of his part of the operation refer to his operative note.  Once Dr. Lucia Gaskins had provided exposure to the sphenoid sinus we brought the operative microscope into the field and under his medication and illumination completed the resection of the tumor. Being careful to stay in the midline I incise the scar tissue from the prior operation with the 15 blade scalpel in a cruciate fashion. The scar tissue was somewhat thick. After his dissecting through scar tissue I encountered tumor consistent with a pituitary adenoma. We obtain specimens and sent this off to the pathologist. Most of the tumor went up to suction. I used the various curettes to remove  tumor anterior, posterior, bilaterally and in a cephalad direction. We used intraoperative fluoroscopy to guide [place] in the resection. After removing the tumor the diaphragm sella. We did not see any spinal fluid. We did not encounter much bleeding. We obtained hemostasis using FloSeal. I then irrigated the FloSeal out.  I obtained a abdominal fat graft on the patient's right upper quadrant. We used this to plug the cruciate incision that I made in the scarred dura in the floor of the sella. We secured this in the sella with a piece of cartilage. At this point Dr. Lucia Gaskins perform the closure.

## 2015-06-15 NOTE — Brief Op Note (Signed)
06/15/2015  10:56 AM  PATIENT:  Seth Estrada  65 y.o. male  PRE-OPERATIVE DIAGNOSIS:  pituitary tumor  POST-OPERATIVE DIAGNOSIS:  pituitary tumor  PROCEDURE:  Procedure(s): CRANIOTOMY HYPOPHYSECTOMY TRANSNASAL APPROACH Transphenoidal resection of pituitary tumor  (N/A) TRANSNASAL APPROACH (N/A) BILATERAL TURBINATE REDUCTION (Bilateral)  SURGEON:  Surgeon(s) and Role: Panel 1:    * Nicholle Falzon Pies, MD - Primary    * Eustace Moore, MD  Panel 2:    * Rozetta Nunnery, MD - Primary  PHYSICIAN ASSISTANT:   ASSISTANTS: none   ANESTHESIA:   general  EBL:  Total I/O In: 800 [I.V.:800] Out: 400 [Urine:400]  BLOOD ADMINISTERED:none  DRAINS: none   LOCAL MEDICATIONS USED:  LIDOCAINE with EPI 10 cc  SPECIMEN:  Source of Specimen:  pituitary tumor  DISPOSITION OF SPECIMEN:  PATHOLOGY  COUNTS:  YES  TOURNIQUET:  * No tourniquets in log *  DICTATION: .Other Dictation: Dictation Number [id number]  PLAN OF CARE: Admit to inpatient   PATIENT DISPOSITION:  PACU - hemodynamically stable.   Delay start of Pharmacological VTE agent (>24hrs) due to surgical blood loss or risk of bleeding: yes

## 2015-06-15 NOTE — Discharge Instructions (Signed)
Call Dr Pollie Friar office for follow up in one week. Next Thurs, Fri or [phone]

## 2015-06-15 NOTE — Transfer of Care (Signed)
Immediate Anesthesia Transfer of Care Note  Patient: Seth Estrada  Procedure(s) Performed: Procedure(s): CRANIOTOMY HYPOPHYSECTOMY TRANSNASAL APPROACH Transphenoidal resection of pituitary tumor  (N/A) TRANSNASAL APPROACH (N/A) BILATERAL TURBINATE REDUCTION (Bilateral)  Patient Location: PACU  Anesthesia Type:General  Level of Consciousness: awake, alert  and oriented  Airway & Oxygen Therapy: Patient connected to face mask oxygen  Post-op Assessment: Report given to RN  Post vital signs: stable  Last Vitals:  Filed Vitals:   06/15/15 0641  BP: 143/92  Pulse: 61  Temp: 37.2 C  Resp: 18    Last Pain: There were no vitals filed for this visit.    Patients Stated Pain Goal: 5 ([id number])  Complications: No apparent anesthesia complications

## 2015-06-15 NOTE — H&P (Signed)
Subjective: The patient is a 65 year old white male on whom Dr. Radene Journey and I performed a transsphenoidal resection of a pituitary adenoma a few years ago. A follow-up MRI demonstrated a recurrence of the tumor. I discussed the situation with the patient. We discussed the various treatment options. He has decided to proceed with a repeat transsphenoidal resection of the pituitary tumor.  Past Medical History  Diagnosis Date  . Adrenal insufficiency ([place])     secondary  . Pituitary adenoma ([place])   . Seasonal allergies   . Hypothyroidism   . Arthritis     Past Surgical History  Procedure Laterality Date  . Pituitary excision  02/2009    [facility]  . Shoulder surgery  2006,2011    bilateral rotator cuff  . Hand surgery Left 2000  . Hernia repair Left 1991    Allergies  Allergen Reactions  . Garlic Other (See Comments)    "Makes me feel lousy"    Social History  Substance Use Topics  . Smoking status: Never Smoker   . Smokeless tobacco: Never Used  . Alcohol Use: No    Family History  Problem Relation Age of Onset  . CAD Mother   . Colon cancer Father    Prior to Admission medications   Medication Sig Start Date End Date Taking? Authorizing Provider  aspirin EC 81 MG tablet Take 81 mg by mouth daily.   Yes Historical Provider, MD  fexofenadine (ALLEGRA) 180 MG tablet Take 180 mg by mouth daily.   Yes Historical Provider, MD  fluticasone (FLONASE) 50 MCG/ACT nasal spray Place 1 spray into both nostrils daily.   Yes Historical Provider, MD  GLUCOSAMINE-CHONDROITIN DS PO Take 2 capsules by mouth daily.   Yes Historical Provider, MD  hydrocortisone (CORTEF) 10 MG tablet Take 10-15 mg by mouth 2 (two) times daily. 15 mg in the morning 10mg  in the evening 12/04/13  Yes Historical Provider, MD  levothyroxine (SYNTHROID, LEVOTHROID) 50 MCG tablet Take 50 mcg by mouth.   Yes Historical Provider, MD  naproxen sodium (ANAPROX) 220 MG tablet Take 220 mg by mouth 2 (two) times  daily as needed.   Yes Historical Provider, MD  rosuvastatin (CRESTOR) 5 MG tablet Take 5 mg by mouth at bedtime.    Yes Historical Provider, MD  testosterone cypionate (DEPOTESTOTERONE CYPIONATE) 200 MG/ML injection Inject 0.6 mLs into the muscle every 14 (fourteen) days. 12/13/13  Yes Historical Provider, MD     Review of Systems  Positive ROS: As above  All other systems have been reviewed and were otherwise negative with the exception of those mentioned in the HPI and as above.  Objective: Vital signs in last 24 hours: Temp:  [98.9 F (37.2 C)] 98.9 F (37.2 C) (05/25 0641) Pulse Rate:  [61] 61 (05/25 0641) Resp:  [18] 18 (05/25 0641) BP: (143)/(92) 143/92 mmHg (05/25 0641) SpO2:  [96 %] 96 % (05/25 0641) Weight:  [92.401 kg (203 lb 11.3 oz)] 92.401 kg (203 lb 11.3 oz) (05/25 0610)  General Appearance: Alert, cooperative, no distress, Head: Normocephalic, without obvious abnormality, atraumatic Eyes: PERRL, conjunctiva/corneas clear, EOM's intact,    Ears: Normal  Throat: Normal  Neck: Supple, symmetrical, trachea midline, no adenopathy; thyroid: No enlargement/tenderness/nodules; no carotid bruit or JVD Back: Symmetric, no curvature, ROM normal, no CVA tenderness Lungs: Clear to auscultation bilaterally, respirations unlabored Heart: Regular rate and rhythm, no murmur, rub or gallop Abdomen: Soft, non-tender,, no masses, no organomegaly Extremities: Extremities normal, atraumatic, no cyanosis or  edema Pulses: 2+ and symmetric all extremities Skin: Skin color, texture, turgor normal, no rashes or lesions  NEUROLOGIC:   Mental status: alert and oriented, no aphasia, good attention span, Fund of knowledge/ memory ok Motor Exam - grossly normal Sensory Exam - grossly normal Reflexes:  Coordination - grossly normal Gait - grossly normal Balance - grossly normal Cranial Nerves: I: smell Not tested  II: visual acuity  OS: Normal  OD: Normal   II: visual fields Full to  confrontation  II: pupils Equal, round, reactive to light  III,VII: ptosis None  III,IV,VI: extraocular muscles  Full ROM  V: mastication Normal  V: facial light touch sensation  Normal  V,VII: corneal reflex  Present  VII: facial muscle function - upper  Normal  VII: facial muscle function - lower Normal  VIII: hearing Not tested  IX: soft palate elevation  Normal  IX,X: gag reflex Present  XI: trapezius strength  5/5  XI: sternocleidomastoid strength 5/5  XI: neck flexion strength  5/5  XII: tongue strength  Normal    Data Review Lab Results  Component Value Date   WBC 6.9 06/08/2015   HGB 16.1 06/08/2015   HCT 47.8 06/08/2015   MCV 93.7 06/08/2015   PLT 155 06/08/2015   Lab Results  Component Value Date   NA 137 06/08/2015   K 4.4 06/08/2015   CL 103 06/08/2015   CO2 25 06/08/2015   BUN 22* 06/08/2015   CREATININE 1.15 06/08/2015   GLUCOSE 97 06/08/2015   No results found for: INR, PROTIME  Assessment/Plan: Recurrent. Pituitary adenoma: I have discussed the situation with the patient and his wife. I have reviewed his MRI scan with them. We have discussed the various treatment options. I have described the surgical treatment option of a redo transsphenoidal resection of the tumor. We have discussed the risks, benefits, alternatives, and likelihood of achieving our goals with surgery. I have answered all the patient's questions. The patient has seen Dr. Lucia Gaskins. The patient has decided to proceed with surgery.  Clatie Kessen D 06/15/2015 7:23 AM

## 2015-06-15 NOTE — Progress Notes (Signed)
Subjective:  The patient is alert and pleasant. He looks well. He is in no apparent distress.  Objective: Vital signs in last 24 hours: Temp:  [98.9 F (37.2 C)] 98.9 F (37.2 C) (05/25 0641) Pulse Rate:  [61] 61 (05/25 0641) Resp:  [18] 18 (05/25 0641) BP: (143)/(92) 143/92 mmHg (05/25 0641) SpO2:  [96 %] 96 % (05/25 0641) Weight:  [92.401 kg (203 lb 11.3 oz)] 92.401 kg (203 lb 11.3 oz) (05/25 0610)  Intake/Output from previous day:   Intake/Output this shift: Total I/O In: 800 [I.V.:800] Out: 400 [Urine:400]  Physical exam the patient is alert and oriented. He is moving all 4 extremities well. His speech is normal. His vision is grossly normal bilaterally. The patient's packs are dry.  Lab Results: No results for input(s): WBC, HGB, HCT, PLT in the last 72 hours. BMET No results for input(s): NA, K, CL, CO2, GLUCOSE, BUN, CREATININE, CALCIUM in the last 72 hours.  Studies/Results: Dg Skull 1-3 Views  06/15/2015  CLINICAL DATA:  Transsphenoidal excision of pituitary lesion EXAM: SKULL - 1-3 VIEW COMPARISON:  MR brain of 04/25/2015 FINDINGS: A single C-arm spot film was returned showing an instrument overlying the enlarged sella turcica in the lateral view. IMPRESSION: Instrument overlies the enlarged sella turcica on the lateral view. Electronically Signed   By: Ivar Drape M.D.   On: 06/15/2015 10:46   Dg C-arm 1-60 Min  06/15/2015  CLINICAL DATA:  A excision of pituitary mass EXAM: DG C-ARM 61-120 MIN COMPARISON:  MR brain of 04/25/2015 FINDINGS: C-arm fluoroscopy was provided for localization of the enlarged sella turcica. Fluoroscopy time of 20.4 seconds is noted. IMPRESSION: C-arm fluoroscopy provided. Electronically Signed   By: Ivar Drape M.D.   On: 06/15/2015 10:46    Assessment/Plan: The patient is doing well. I spoke with his family.  LOS: 0 days     Tykee Heideman D 06/15/2015, 11:15 AM

## 2015-06-15 NOTE — Anesthesia Preprocedure Evaluation (Addendum)
Anesthesia Evaluation  Patient identified by MRN, date of birth, ID band Patient awake    Reviewed: Allergy & Precautions, NPO status , Patient's Chart, lab work & pertinent test results  Airway Mallampati: II  TM Distance: >3 FB Neck ROM: Full    Dental no notable dental hx. (+) Teeth Intact, Dental Advisory Given   Pulmonary neg pulmonary ROS,    Pulmonary exam normal breath sounds clear to auscultation       Cardiovascular negative cardio ROS Normal cardiovascular exam Rhythm:Regular Rate:Normal     Neuro/Psych Pituitary ademoa, recurrence negative neurological ROS  negative psych ROS   GI/Hepatic negative GI ROS, Neg liver ROS, GERD  Medicated and Controlled,  Endo/Other  Hypothyroidism Adrenal insuffiency  Renal/GU negative Renal ROS  negative genitourinary   Musculoskeletal negative musculoskeletal ROS (+)   Abdominal (+)  Abdomen: soft. Bowel sounds: normal.  Peds negative pediatric ROS (+)  Hematology negative hematology ROS (+)   Anesthesia Other Findings   Reproductive/Obstetrics negative OB ROS                          Anesthesia Physical Anesthesia Plan  ASA: II  Anesthesia Plan: General   Post-op Pain Management:    Induction: Intravenous  Airway Management Planned: Oral ETT  Additional Equipment: Arterial line  Intra-op Plan:   Post-operative Plan: Extubation in OR  Informed Consent: I have reviewed the patients History and Physical, chart, labs and discussed the procedure including the risks, benefits and alternatives for the proposed anesthesia with the patient or authorized representative who has indicated his/her understanding and acceptance.   Dental advisory given  Plan Discussed with: CRNA  Anesthesia Plan Comments:        Anesthesia Quick Evaluation

## 2015-06-16 ENCOUNTER — Encounter (HOSPITAL_COMMUNITY): Payer: Self-pay | Admitting: Neurosurgery

## 2015-06-16 LAB — BASIC METABOLIC PANEL
Anion gap: 8 (ref 5–15)
BUN: 12 mg/dL (ref 6–20)
CHLORIDE: 109 mmol/L (ref 101–111)
CO2: 24 mmol/L (ref 22–32)
Calcium: 8.6 mg/dL — ABNORMAL LOW (ref 8.9–10.3)
Creatinine, Ser: 1.17 mg/dL (ref 0.61–1.24)
GFR calc non Af Amer: >60 mL/min (ref >60)
Glucose, Bld: 170 mg/dL — ABNORMAL HIGH (ref 65–99)
POTASSIUM: 4.2 mmol/L (ref 3.5–5.1)
SODIUM: 141 mmol/L (ref 135–145)

## 2015-06-16 MED ORDER — CEPHALEXIN 500 MG PO CAPS: 500.0000 mg | ORAL_CAPSULE | Freq: Two times a day (BID) | ORAL | Status: DC

## 2015-06-16 NOTE — Progress Notes (Signed)
Patient ID: Seth Estrada, male   DOB: 04-18-1950, 65 y.o.   MRN: [id number] Subjective:  the patient is alert and pleasant.  He looks and feels well.  He has no complaints.  Objective: Vital signs in last 24 hours: Temp:  [97.1 F (36.2 C)-98.4 F (36.9 C)] 98.1 F (36.7 C) (05/26 0800) Pulse Rate:  [57-95] 69 (05/26 1200) Resp:  [10-30] 11 (05/26 1200) BP: (68-170)/(41-94) 129/73 mmHg (05/26 1200) SpO2:  [93 %-100 %] 98 % (05/26 1200) Arterial Line BP: (57-182)/(29-110) 119/75 mmHg (05/26 1100)  Intake/Output from previous day: 05/25 0701 - 05/26 0700 In: 4507.5 [P.O.:1480; I.V.:2522.5; IV Piggyback:505] Out: 6125 [Urine:6125] Intake/Output this shift: Total I/O In: [place] [P.O.:1440; I.V.:300; IV Piggyback:100] Out: 1175 [Urine:1175]  Physical exam he patient is alert and oriented 3.  His speech, strength, and vision is grossly normal.  Lab Results: No results for input(s): WBC, HGB, HCT, PLT in the last 72 hours. BMET  Recent Labs  06/16/15 0540  NA 141  K 4.2  CL 109  CO2 24  GLUCOSE 170*  BUN 12  CREATININE 1.17  CALCIUM 8.6*    Studies/Results: Dg Skull 1-3 Views  06/15/2015  CLINICAL DATA:  Transsphenoidal excision of pituitary lesion EXAM: SKULL - 1-3 VIEW COMPARISON:  MR brain of 04/25/2015 FINDINGS: A single C-arm spot film was returned showing an instrument overlying the enlarged sella turcica in the lateral view. IMPRESSION: Instrument overlies the enlarged sella turcica on the lateral view. Electronically Signed   By: Ivar Drape M.D.   On: 06/15/2015 10:46   Dg C-arm 1-60 Min  06/15/2015  CLINICAL DATA:  A excision of pituitary mass EXAM: DG C-ARM 61-120 MIN COMPARISON:  MR brain of 04/25/2015 FINDINGS: C-arm fluoroscopy was provided for localization of the enlarged sella turcica. Fluoroscopy time of 20.4 seconds is noted. IMPRESSION: C-arm fluoroscopy provided. Electronically Signed   By: Ivar Drape M.D.   On: 06/15/2015 10:46     Assessment/Plan: Postop day #1: The patient is doing well.  He doesn't seem to be in diabetes insipidus.  I will plan to check his sodium again tomorrow.  If it is normal, he can be discharged.  I gave him his discharge instructions.  I have answered all his questions.   LOS: 1 day     Rhyse Skowron D 06/16/2015, 12:06 PM

## 2015-06-16 NOTE — Progress Notes (Signed)
POD 1 AF VSS Awake alert. No bleeding Packing intact Will have Dr Ernesto Rutherford remove packing tomorrow am. Patient will follow up in my office in 1 week to have septal splints removed Discharge home on Keflex 500 mg bid

## 2015-06-16 NOTE — Op Note (Signed)
NAMELINWOOD, FRIIS NO.:  [id number]  MEDICAL RECORD NO.:  [id number]  LOCATION:  3M01C                        FACILITY:  [facility]  PHYSICIAN:  Leonides Sake. Lucia Gaskins, M.D.DATE OF BIRTH:  1950/08/05  DATE OF PROCEDURE: DATE OF DISCHARGE:                              OPERATIVE REPORT   POSTOPERATIVE DIAGNOSIS:  Pituitary tumor, turbinate hypertrophy with nasal obstruction.  POSTOPERATIVE DIAGNOSIS:  Pituitary tumor, turbinate hypertrophy with nasal obstruction.  OPERATION PERFORMED:  Transseptal transsphenoidal resection of recurrent pituitary tumor. Bilateral inferior turbinate reductions with Elmed bipolar cautery.  SURGEON:  Leonides Sake. Lucia Gaskins, M.D.  Lolly Mustache:  [facility].  ANESTHESIA:  General endotracheal.  COMPLICATIONS:  None.  ESTIMATED BLOOD LOSS:  Minimal.  BRIEF CLINICAL NOTE:  Seth Estrada is a 65 year old gentleman who has had previous pituitary tumor removed 3-4 years ago.  He had had recurrence of the tumor on MRI scan with pressure against the optic chiasm.  He was taken back to operating room this time for repeat transseptal transsphenoidal hypophysectomy.  In addition, he has some nasal obstruction, has mild septal deformity to the left with large turbinates and we will plan on turbinate reductions at the same time as resection of pituitary tumor.  DESCRIPTION OF PROCEDURE:  The patient was brought to the operating room and placed in the supine position.  After positioning, C-arm was used to check and locate the sella.  Next, I prepped the nose with Betadine and further prepped the nose with cotton pledgets soaked in Afrin and injected 8-10 mL of Xylocaine with epinephrine into the sublabial area, septum, and inferior turbinates.  Started the approach with an extended incision along the caudal edge of septum down to the floor of the nose on the right side.  Mucoperichondrial-mucoperiosteal flaps were elevated off the right  side of the septum down along the floor of the nose. Additional elevation was carried out along the periosteum of the floor of the left side of the nose.  The anterior cartilaginous septum was dissected off the maxillary crest and displaced to the patient's left side.  Dissection was carried down between the mucoperichondrial- mucoperiosteal flaps posteriorly.  Patient had previous resection of most of the bony septum posteriorly, some of the small residual cartilage and bony septum that remained was removed and set aside to be used later for closure.  On hitting the face of the sphenoid sinus, previous osteotomy of the sphenoid sinus was performed and there was really no bone in this area.  The Hardy retractor was positioned and the sphenoid sinus was opened up with a Takahashi forceps, some of the previous fat and scar tissue was removed from the anterior portion of the sphenoid sinus.  The microscope was brought in and Dr. Arnoldo Morale was called in to perform a resection of the scar tissue fat from the sphenoid sinus for removal of the recurrent or persistent pituitary tumor.  There was really minimal bleeding during the opening of the procedure.  Dr. Arnoldo Morale then performed resection of the recurrent pituitary tumor.  I was called back in for closure.  Some fat had been harvested from the belly by Dr. Arnoldo Morale and placed in the sella and sphenoid  sinus region.  The cartilaginous septum anteriorly was placed back in maxillary crest.  Both the mucoperichondrial and mucoperiosteal flaps were brought back to midline.  The extended hemitransfixion incision was closed with interrupted 4-0 chromic sutures x4.  The septum was fixed with a 4-0 chromic suture.  Then using the Elmed bipolar cautery, bipolar submucosal reduction of the inferior turbinates were performed bilaterally.  Remaining turbinate bone was outfractured. Doyle splints were secured either side of the septum with a 3-0 nylon suture.   The nose was then packed with Merocel packs soaked in bacitracin ointment, placed on either side of the nose.  This completed the procedure.  The patient was subsequently awaken from anesthesia and transferred to recovery room postop, doing well.  DISPOSITION:  He will be admitted to the neuro ICU for observation.  We will plan on removing his nasal septal pack in 2 days and have him follow up in 1 week to have the splints removed.          ______________________________ Leonides Sake Lucia Gaskins, M.D.     CEN/MEDQ  D:  06/15/2015  T:  06/16/2015  Job:  [id number]

## 2015-06-17 MED ORDER — HYDROCODONE-ACETAMINOPHEN 5-325 MG PO TABS: 1.0000 | ORAL_TABLET | ORAL | Status: DC | PRN

## 2015-06-17 MED ORDER — ASPIRIN EC 81 MG PO TBEC: 81.0000 mg | DELAYED_RELEASE_TABLET | Freq: Every day | ORAL | Status: AC

## 2015-06-17 NOTE — Discharge Summary (Signed)
  Physician Discharge Summary  Patient ID: ACESYN MAIA MRN: [id number] DOB/AGE: 65-Jun-1952 65 y.o.  Admit date: 06/15/2015 Discharge date: 06/17/2015  Admission Diagnoses: Pituitary adenoma  Discharge Diagnoses: Same Active Problems:   Pituitary adenoma [facility])   Discharged Condition: Stable  Hospital Course:  Mrs. Seth Estrada is a 65 y.o. male electively admitted after uncomplicated repeat resection of pituitary adenoma. He was at baseline postop, without evidence of leakage. He had matched urine output and stable serum Na. He was tolerating diet, ambulating well with pain under control. Nasal packing was removed by ENT.  Treatments: Surgery - Transsphenoidal resection of pituitary tumor  Discharge Exam: Blood pressure 148/86, pulse 47, temperature 97.7 F (36.5 C), temperature source Oral, resp. rate 12, height 5' 11.5" (1.816 m), weight 92.401 kg (203 lb 11.3 oz), SpO2 97 %. Awake, alert, oriented Speech fluent, appropriate CN grossly intact 5/5 BUE/BLE  Disposition: 01-Home or Self Care     Medication List    TAKE these medications        aspirin EC 81 MG tablet  Take 1 tablet (81 mg total) by mouth daily.  Start taking on:  06/20/2015     cephALEXin 500 MG capsule  Commonly known as:  KEFLEX  Take 1 capsule (500 mg total) by mouth 2 (two) times daily.     fexofenadine 180 MG tablet  Commonly known as:  ALLEGRA  Take 180 mg by mouth daily.     fluticasone 50 MCG/ACT nasal spray  Commonly known as:  FLONASE  Place 1 spray into both nostrils daily.     GLUCOSAMINE-CHONDROITIN DS PO  Take 2 capsules by mouth daily.     HYDROcodone-acetaminophen 5-325 MG tablet  Commonly known as:  NORCO/VICODIN  Take 1 tablet by mouth every 4 (four) hours as needed for moderate pain.     hydrocortisone 10 MG tablet  Commonly known as:  CORTEF  Take 10-15 mg by mouth 2 (two) times daily. 15 mg in the morning 10mg  in the evening     levothyroxine 50 MCG tablet   Commonly known as:  SYNTHROID, LEVOTHROID  Take 50 mcg by mouth.     naproxen sodium 220 MG tablet  Commonly known as:  ANAPROX  Take 220 mg by mouth 2 (two) times daily as needed.     rosuvastatin 5 MG tablet  Commonly known as:  CRESTOR  Take 5 mg by mouth at bedtime.     testosterone cypionate 200 MG/ML injection  Commonly known as:  DEPOTESTOSTERONE CYPIONATE  Inject 0.6 mLs into the muscle every 14 (fourteen) days.           Follow-up Information    Follow up with Melony Overly, MD In 1 week.   Specialty:  Otolaryngology   Why:  For wound re-check   Contact information:   [place] [address] [phone]       Follow up with Ophelia Charter, MD In 3 weeks.   Specialty:  Neurosurgery   Contact information:   [address] [place] [address] [place] [address] [phone]       Signed: Consuella Lose, Loletha Grayer 06/17/2015, 9:06 AM

## 2015-06-19 LAB — POCT I-STAT 7, (LYTES, BLD GAS, ICA,H+H)
ACID-BASE EXCESS: 1 mmol/L (ref 0.0–2.0)
Bicarbonate: 26.3 mEq/L — ABNORMAL HIGH (ref 20.0–24.0)
CALCIUM ION: 1.11 mmol/L — AB (ref 1.13–1.30)
HEMATOCRIT: 42 % (ref 39.0–52.0)
Hemoglobin: 14.3 g/dL (ref 13.0–17.0)
O2 Saturation: 100 %
PH ART: 7.385 (ref 7.350–7.450)
POTASSIUM: 3.1 mmol/L — AB (ref 3.5–5.1)
SODIUM: 141 mmol/L (ref 135–145)
TCO2: 28 mmol/L (ref 0–100)
pCO2 arterial: 43.9 mmHg (ref 35.0–45.0)
pO2, Arterial: 189 mmHg — ABNORMAL HIGH (ref 80.0–100.0)

## 2015-08-09 ENCOUNTER — Other Ambulatory Visit: Payer: Self-pay | Admitting: Neurosurgery

## 2015-08-09 DIAGNOSIS — D497 Neoplasm of unspecified behavior of endocrine glands and other parts of nervous system: Secondary | ICD-10-CM

## 2015-08-22 ENCOUNTER — Ambulatory Visit
Admission: RE | Admit: 2015-08-22 | Discharge: 2015-08-22 | Disposition: A | Discharge status: Home / Self Care | Payer: Medicare Other | Source: Ambulatory Visit | Attending: Neurosurgery | Admitting: Neurosurgery

## 2015-08-22 DIAGNOSIS — D497 Neoplasm of unspecified behavior of endocrine glands and other parts of nervous system: Secondary | ICD-10-CM

## 2015-08-22 MED ORDER — GADOBENATE DIMEGLUMINE 529 MG/ML IV SOLN
10.0000 mL | Freq: Once | INTRAVENOUS | Status: AC | PRN
  Administered 2015-08-22: 10 mL via INTRAVENOUS

## 2015-09-07 ENCOUNTER — Other Ambulatory Visit: Payer: Self-pay | Admitting: Neurosurgery

## 2015-09-07 DIAGNOSIS — D352 Benign neoplasm of pituitary gland: Secondary | ICD-10-CM

## 2015-09-12 ENCOUNTER — Ambulatory Visit
Admission: RE | Admit: 2015-09-12 | Discharge: 2015-09-12 | Disposition: A | Discharge status: Home / Self Care | Payer: Medicare Other | Source: Ambulatory Visit | Attending: Neurosurgery | Admitting: Neurosurgery

## 2015-09-12 DIAGNOSIS — D352 Benign neoplasm of pituitary gland: Secondary | ICD-10-CM

## 2015-09-13 DIAGNOSIS — M25331 Other instability, right wrist: Secondary | ICD-10-CM | POA: Insufficient documentation

## 2015-09-14 ENCOUNTER — Other Ambulatory Visit: Payer: Self-pay | Admitting: Orthopedic Surgery

## 2015-09-14 DIAGNOSIS — M25531 Pain in right wrist: Secondary | ICD-10-CM

## 2015-09-19 ENCOUNTER — Other Ambulatory Visit: Payer: Medicare Other

## 2015-10-03 ENCOUNTER — Ambulatory Visit
Admission: RE | Admit: 2015-10-03 | Discharge: 2015-10-03 | Disposition: A | Discharge status: Home / Self Care | Payer: Medicare Other | Source: Ambulatory Visit | Attending: Orthopedic Surgery | Admitting: Orthopedic Surgery

## 2015-10-03 DIAGNOSIS — M25531 Pain in right wrist: Secondary | ICD-10-CM

## 2015-10-03 MED ORDER — IOPAMIDOL (ISOVUE-M 200) INJECTION 41%
2.0000 mL | Freq: Once | INTRAMUSCULAR | Status: AC
  Administered 2015-10-03: 2 mL via INTRA_ARTICULAR

## 2015-10-09 DIAGNOSIS — S6981XA Other specified injuries of right wrist, hand and finger(s), initial encounter: Secondary | ICD-10-CM | POA: Insufficient documentation

## 2016-03-25 ENCOUNTER — Encounter (HOSPITAL_COMMUNITY): Payer: Self-pay | Admitting: Emergency Medicine

## 2016-03-25 ENCOUNTER — Emergency Department (HOSPITAL_COMMUNITY)
Admission: EM | Admit: 2016-03-25 | Discharge: 2016-03-25 | Disposition: A | Discharge status: Home / Self Care | Payer: Medicare Other | Attending: Emergency Medicine | Admitting: Emergency Medicine

## 2016-03-25 DIAGNOSIS — Z7982 Long term (current) use of aspirin: Secondary | ICD-10-CM | POA: Diagnosis not present

## 2016-03-25 DIAGNOSIS — E039 Hypothyroidism, unspecified: Secondary | ICD-10-CM | POA: Diagnosis not present

## 2016-03-25 DIAGNOSIS — R197 Diarrhea, unspecified: Secondary | ICD-10-CM | POA: Diagnosis present

## 2016-03-25 DIAGNOSIS — K529 Noninfective gastroenteritis and colitis, unspecified: Secondary | ICD-10-CM | POA: Diagnosis not present

## 2016-03-25 LAB — CBC WITH DIFFERENTIAL/PLATELET
Basophils Absolute: 0 10*3/uL (ref 0.0–0.1)
Basophils Relative: 0 %
Eosinophils Absolute: 0.1 10*3/uL (ref 0.0–0.7)
Eosinophils Relative: 1 %
HEMATOCRIT: 54.3 % — AB (ref 39.0–52.0)
HEMOGLOBIN: 18.6 g/dL — AB (ref 13.0–17.0)
LYMPHS ABS: 0.3 10*3/uL — AB (ref 0.7–4.0)
LYMPHS PCT: 3 %
MCH: 32.3 pg (ref 26.0–34.0)
MCHC: 34.3 g/dL (ref 30.0–36.0)
MCV: 94.3 fL (ref 78.0–100.0)
MONOS PCT: 5 %
Monocytes Absolute: 0.5 10*3/uL (ref 0.1–1.0)
NEUTROS ABS: 10.2 10*3/uL — AB (ref 1.7–7.7)
NEUTROS PCT: 91 %
Platelets: 139 10*3/uL — ABNORMAL LOW (ref 150–400)
RBC: 5.76 MIL/uL (ref 4.22–5.81)
RDW: 13.3 % (ref 11.5–15.5)
WBC: 11 10*3/uL — AB (ref 4.0–10.5)

## 2016-03-25 LAB — COMPREHENSIVE METABOLIC PANEL
ALK PHOS: 46 U/L (ref 38–126)
ALT: 19 U/L (ref 17–63)
ANION GAP: 6 (ref 5–15)
AST: 27 U/L (ref 15–41)
Albumin: 4.6 g/dL (ref 3.5–5.0)
BUN: 25 mg/dL — ABNORMAL HIGH (ref 6–20)
CALCIUM: 8.7 mg/dL — AB (ref 8.9–10.3)
CO2: 27 mmol/L (ref 22–32)
Chloride: 106 mmol/L (ref 101–111)
Creatinine, Ser: 1.29 mg/dL — ABNORMAL HIGH (ref 0.61–1.24)
GFR calc non Af Amer: 57 mL/min — ABNORMAL LOW (ref >60)
GLUCOSE: 101 mg/dL — AB (ref 65–99)
Potassium: 4.8 mmol/L (ref 3.5–5.1)
SODIUM: 139 mmol/L (ref 135–145)
TOTAL PROTEIN: 7 g/dL (ref 6.5–8.1)
Total Bilirubin: 2.5 mg/dL — ABNORMAL HIGH (ref 0.3–1.2)

## 2016-03-25 MED ORDER — SODIUM CHLORIDE 0.9 % IV BOLUS (SEPSIS)
1000.0000 mL | Freq: Once | INTRAVENOUS | Status: AC
  Administered 2016-03-25: 1000 mL via INTRAVENOUS

## 2016-03-25 MED ORDER — HYDROCORTISONE NA SUCCINATE PF 100 MG IJ SOLR
100.0000 mg | Freq: Once | INTRAMUSCULAR | Status: AC
  Administered 2016-03-25: 100 mg via INTRAVENOUS
  Filled 2016-03-25: qty 2

## 2016-03-25 MED ORDER — ACETAMINOPHEN 325 MG PO TABS
650.0000 mg | ORAL_TABLET | Freq: Once | ORAL | Status: AC
  Administered 2016-03-25: 650 mg via ORAL
  Filled 2016-03-25: qty 2

## 2016-03-25 MED ORDER — ONDANSETRON 4 MG PO TBDP: 4.0000 mg | ORAL_TABLET | Freq: Three times a day (TID) | ORAL | 0 refills | Status: DC | PRN

## 2016-03-25 NOTE — ED Notes (Signed)
Bed: WA22 Expected date:  Expected time:  Means of arrival:  Comments: EMS-fall 

## 2016-03-25 NOTE — ED Notes (Signed)
Attempted to pull blood work off IV. Would not pull back.

## 2016-03-25 NOTE — ED Triage Notes (Signed)
Per EMS. Pt from home. Just got back from scuba diving vacation in [place]. Started having n/v/d since 0500 this am. Pt had syncopal episode on toilet this am as well. EMS gave 4mg  zofran prior to arrival. EMS also noted CO2 of 38.

## 2016-03-25 NOTE — ED Provider Notes (Signed)
[facility] DEPT Provider Note   CSN: [place]:[id number] Arrival date & time: 03/25/16  [id number]     History   Chief Complaint Chief Complaint  Patient presents with  . Emesis  . Diarrhea    HPI Seth Estrada is a 66 y.o. male.  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 Patient came back from a two-week travel to [place] last night. He last ate last night around 10 PM having a salad. This morning he awoke and has had multiple loose watery bowel movements and 2 episodes of vomiting. No blood in either. Denies any abdominal pain. However he has felt lightheaded and dizzy, especially around the times of vomiting. While he was sitting on the toilet he briefly passed out according to his wife. He did not fall or hit his head. He's had an on and off headache since this started, rates it as a 5/10. He called his endocrinologist and was told to take an extra dose of his hydrocortisone this morning. He's not sure if the first or second dose today stayed in his system. Nausea currently feels better. He was given Zofran by EMS. His mouth feels very dry. He never had chest pain or shortness of breath. Does not currently feel dizzy.  Past Medical History:  Diagnosis Date  . Adrenal insufficiency ([place])    secondary  . Arthritis   . Hypothyroidism   . Pituitary adenoma ([place])   . Seasonal allergies     Patient Active Problem List   Diagnosis Date Noted  . Pituitary adenoma ([place]) 06/15/2015  . Adrenal insufficiency ([place]) 01/30/2014  . Nausea vomiting and diarrhea     Past Surgical History:  Procedure Laterality Date  . CRANIOTOMY N/A 06/15/2015   Procedure: CRANIOTOMY HYPOPHYSECTOMY TRANSNASAL APPROACH Transphenoidal resection of pituitary tumor ;  Surgeon: Newman Pies, MD;  Location: [facility];  Service: Neurosurgery;  Laterality: N/A;  . HAND SURGERY Left 2000  . HERNIA REPAIR  Left 1991  . PITUITARY EXCISION  02/2009   Newman Pies  . SHOULDER SURGERY  2006,2011   bilateral rotator cuff  . TRANSNASAL APPROACH N/A 06/15/2015   Procedure: TRANSNASAL APPROACH;  Surgeon: Rozetta Nunnery, MD;  Location: [facility];  Service: ENT;  Laterality: N/A;  . TURBINATE REDUCTION Bilateral 06/15/2015   Procedure: BILATERAL TURBINATE REDUCTION;  Surgeon: Rozetta Nunnery, MD;  Location: [facility];  Service: ENT;  Laterality: Bilateral;       Home Medications    Prior to Admission medications   Medication Sig Start Date End Date Taking? Authorizing Provider  aspirin EC 81 MG tablet Take 1 tablet (81 mg total) by mouth daily. 06/20/15   Consuella Lose, MD  cephALEXin (KEFLEX) 500 MG capsule Take 1 capsule (500 mg total) by mouth 2 (two) times daily. 06/16/15   Rozetta Nunnery, MD  fexofenadine (ALLEGRA) 180 MG tablet Take 180 mg by mouth daily.    Historical Provider, MD  fluticasone (FLONASE) 50 MCG/ACT nasal spray Place 1 spray into both nostrils daily.    Historical Provider, MD  GLUCOSAMINE-CHONDROITIN DS PO Take 2 capsules by mouth daily.    Historical Provider, MD  HYDROcodone-acetaminophen (NORCO/VICODIN) 5-325 MG tablet Take 1 tablet by mouth every 4 (four) hours as needed for moderate pain. 06/17/15   Consuella Lose, MD  hydrocortisone (CORTEF) 10 MG tablet Take 10-15 mg by mouth 2 (two) times daily. 15 mg  in the morning 10mg  in the evening 12/04/13   Historical Provider, MD  levothyroxine (SYNTHROID, LEVOTHROID) 50 MCG tablet Take 50 mcg by mouth.    Historical Provider, MD  naproxen sodium (ANAPROX) 220 MG tablet Take 220 mg by mouth 2 (two) times daily as needed.    Historical Provider, MD  ondansetron (ZOFRAN ODT) 4 MG disintegrating tablet Take 1 tablet (4 mg total) by mouth every 8 (eight) hours as needed for nausea or vomiting. 03/25/16   Sherwood Gambler, MD  rosuvastatin (CRESTOR) 5 MG tablet Take 5 mg by mouth at bedtime.     Historical  Provider, MD  testosterone cypionate (DEPOTESTOTERONE CYPIONATE) 200 MG/ML injection Inject 0.6 mLs into the muscle every 14 (fourteen) days. 12/13/13   Historical Provider, MD    Family History Family History  Problem Relation Age of Onset  . CAD Mother   . Colon cancer Father     Social History Social History  Substance Use Topics  . Smoking status: Never Smoker  . Smokeless tobacco: Never Used  . Alcohol use No     Allergies   Garlic   Review of Systems Review of Systems  Constitutional: Positive for fatigue. Negative for fever.  Gastrointestinal: Positive for diarrhea, nausea and vomiting. Negative for abdominal pain and blood in stool.  Neurological: Positive for weakness (diffuse) and headaches.  All other systems reviewed and are negative.    Physical Exam Updated Vital Signs BP 106/74   Pulse 91   Temp 98.3 F (36.8 C) (Oral)   Resp 16   SpO2 93%   Physical Exam  Constitutional: He is oriented to person, place, and time. He appears well-developed and well-nourished.  HENT:  Head: Normocephalic and atraumatic.  Right Ear: External ear normal.  Left Ear: External ear normal.  Nose: Nose normal.  Mouth/Throat: Mucous membranes are dry.  Eyes: Right eye exhibits no discharge. Left eye exhibits no discharge.  Neck: Neck supple.  Cardiovascular: Normal rate, regular rhythm and normal heart sounds.   Pulmonary/Chest: Effort normal and breath sounds normal.  Abdominal: Soft. He exhibits no distension. There is no tenderness.  Musculoskeletal: He exhibits no edema.  Neurological: He is alert and oriented to person, place, and time.  Skin: Skin is warm and dry.  Nursing note and vitals reviewed.    ED Treatments / Results  Labs (all labs ordered are listed, but only abnormal results are displayed) Labs Reviewed  CBC WITH DIFFERENTIAL/PLATELET - Abnormal; Notable for the following:       Result Value   WBC 11.0 (*)    Hemoglobin 18.6 (*)    HCT 54.3  (*)    Platelets 139 (*)    Neutro Abs 10.2 (*)    Lymphs Abs 0.3 (*)    All other components within normal limits  COMPREHENSIVE METABOLIC PANEL - Abnormal; Notable for the following:    Glucose, Bld 101 (*)    BUN 25 (*)    Creatinine, Ser 1.29 (*)    Calcium 8.7 (*)    Total Bilirubin 2.5 (*)    GFR calc non Af Amer 57 (*)    All other components within normal limits    EKG  EKG Interpretation  Date/Time:  Monday March 25 2016 10:05:30 EST Ventricular Rate:  96 PR Interval:    QRS Duration: 81 QT Interval:  342 QTC Calculation: 433 R Axis:   -22 Text Interpretation:  Normal sinus rhythm Borderline left axis deviation Low voltage, precordial leads Consider anterior  infarct no significant change since May 2017 Confirmed by Regenia Skeeter MD, [place] [phone]) on 03/25/2016 10:07:51 AM       Radiology No results found.  Procedures Procedures (including critical care time)  Medications Ordered in ED Medications  sodium chloride 0.9 % bolus 1,000 mL (0 mLs Intravenous Stopped 03/25/16 1135)  hydrocortisone sodium succinate (SOLU-CORTEF) 100 MG injection 100 mg (100 mg Intravenous Given 03/25/16 1025)  acetaminophen (TYLENOL) tablet 650 mg (650 mg Oral Given 03/25/16 1133)  sodium chloride 0.9 % bolus 1,000 mL (0 mLs Intravenous Stopped 03/25/16 1314)     Initial Impression / Assessment and Plan / ED Course  I have reviewed the triage vital signs and the nursing notes.  Pertinent labs & imaging results that were available during my care of the patient were reviewed by me and considered in my medical decision making (see chart for details).  Clinical Course as of Mar 25 1628  Mon Mar 25, 2016  0939 Fluids, labs, tylenol. HA is likely from dehydration. Afebrile. Will give dose of IV hydrocortisone given adrenal insufficiency.  [SG]  1237 Patient is feeling much better. Labs indicate he is dry but no renal failure, or significant electrolyte disturbance. No further diarrhea or vomiting.   [SG]    Clinical Course User Index [SG] Sherwood Gambler, MD    While patient recently traveled he does not have other concerning findings such as fever, bloody diarrhea, or abdominal pain. In fact he has not had diarrhea since earlier this morning or while in the ED. There is no vomiting currently. He is feeling better after IV fluids. At this point, we'll treat with supportive care and recommend follow-up with PCP if no improvement or worsening.  Final Clinical Impressions(s) / ED Diagnoses   Final diagnoses:  Acute gastroenteritis    New Prescriptions Discharge Medication List as of 03/25/2016 12:56 PM    START taking these medications   Details  ondansetron (ZOFRAN ODT) 4 MG disintegrating tablet Take 1 tablet (4 mg total) by mouth every 8 (eight) hours as needed for nausea or vomiting., Starting Mon 03/25/2016, Print         Sherwood Gambler, MD 03/25/16 1630

## 2016-03-26 ENCOUNTER — Other Ambulatory Visit: Payer: Self-pay | Admitting: Neurosurgery

## 2016-03-26 DIAGNOSIS — D352 Benign neoplasm of pituitary gland: Secondary | ICD-10-CM

## 2016-04-09 ENCOUNTER — Ambulatory Visit
Admission: RE | Admit: 2016-04-09 | Discharge: 2016-04-09 | Disposition: A | Discharge status: Home / Self Care | Payer: Medicare Other | Source: Ambulatory Visit | Attending: Neurosurgery | Admitting: Neurosurgery

## 2016-04-09 DIAGNOSIS — D352 Benign neoplasm of pituitary gland: Secondary | ICD-10-CM

## 2016-04-09 MED ORDER — GADOBENATE DIMEGLUMINE 529 MG/ML IV SOLN
10.0000 mL | Freq: Once | INTRAVENOUS | Status: AC | PRN
  Administered 2016-04-09: 10 mL via INTRAVENOUS

## 2017-02-27 ENCOUNTER — Other Ambulatory Visit: Payer: Self-pay | Admitting: Neurosurgery

## 2017-02-27 DIAGNOSIS — D352 Benign neoplasm of pituitary gland: Secondary | ICD-10-CM

## 2017-04-10 ENCOUNTER — Ambulatory Visit
Admission: RE | Admit: 2017-04-10 | Discharge: 2017-04-10 | Disposition: A | Discharge status: Home / Self Care | Payer: Medicare Other | Source: Ambulatory Visit | Attending: Neurosurgery | Admitting: Neurosurgery

## 2017-04-10 DIAGNOSIS — D352 Benign neoplasm of pituitary gland: Secondary | ICD-10-CM

## 2017-04-10 MED ORDER — ATROPINE SULFATE 1 MG/ML IJ SOLN
0.5000 mg | Freq: Once | INTRAMUSCULAR | Status: AC
  Administered 2017-04-10: 0.5 mg via INTRAVENOUS

## 2017-04-10 MED ORDER — GADOBENATE DIMEGLUMINE 529 MG/ML IV SOLN
10.0000 mL | Freq: Once | INTRAVENOUS | Status: AC | PRN
  Administered 2017-04-10: 10 mL via INTRAVENOUS

## 2017-04-10 MED ORDER — ATROPINE SULFATE 1 MG/10ML IJ SOSY: 1.0000 mg | PREFILLED_SYRINGE | Freq: Once | INTRAMUSCULAR | Status: DC

## 2017-04-10 NOTE — Progress Notes (Signed)
Pt had vasovagal response to IV.  Recovered quickly, but had second recurrent episode.  HR 40.  Systolic BP 80.  I elected to give 0.5cc atropine iv at this time.  Responded well.  Was recovered under nursing observation for 20 minutes, then transferred to MRI for the scheduled study.

## 2018-03-17 ENCOUNTER — Other Ambulatory Visit: Payer: Self-pay | Admitting: Neurosurgery

## 2018-03-17 DIAGNOSIS — D352 Benign neoplasm of pituitary gland: Secondary | ICD-10-CM

## 2018-03-27 ENCOUNTER — Other Ambulatory Visit: Payer: Medicare Other

## 2018-04-03 ENCOUNTER — Ambulatory Visit
Admission: RE | Admit: 2018-04-03 | Discharge: 2018-04-03 | Disposition: A | Discharge status: Home / Self Care | Payer: Medicare Other | Source: Ambulatory Visit | Attending: Neurosurgery | Admitting: Neurosurgery

## 2018-04-03 ENCOUNTER — Other Ambulatory Visit: Payer: Self-pay

## 2018-04-03 DIAGNOSIS — D352 Benign neoplasm of pituitary gland: Secondary | ICD-10-CM

## 2018-04-03 MED ORDER — GADOBENATE DIMEGLUMINE 529 MG/ML IV SOLN
8.0000 mL | Freq: Once | INTRAVENOUS | Status: AC | PRN
  Administered 2018-04-03: 8 mL via INTRAVENOUS

## 2018-07-10 ENCOUNTER — Other Ambulatory Visit: Payer: Self-pay | Admitting: Orthopedic Surgery

## 2018-07-10 DIAGNOSIS — S6992XA Unspecified injury of left wrist, hand and finger(s), initial encounter: Secondary | ICD-10-CM

## 2018-07-10 DIAGNOSIS — M24132 Other articular cartilage disorders, left wrist: Secondary | ICD-10-CM

## 2018-08-07 ENCOUNTER — Other Ambulatory Visit: Payer: Self-pay

## 2018-08-07 ENCOUNTER — Ambulatory Visit
Admission: RE | Admit: 2018-08-07 | Discharge: 2018-08-07 | Disposition: A | Discharge status: Home / Self Care | Payer: Medicare Other | Source: Ambulatory Visit | Attending: Orthopedic Surgery | Admitting: Orthopedic Surgery

## 2018-08-07 DIAGNOSIS — M24132 Other articular cartilage disorders, left wrist: Secondary | ICD-10-CM

## 2018-08-07 DIAGNOSIS — S6992XA Unspecified injury of left wrist, hand and finger(s), initial encounter: Secondary | ICD-10-CM

## 2018-08-07 MED ORDER — IOPAMIDOL (ISOVUE-M 200) INJECTION 41%
2.0000 mL | Freq: Once | INTRAMUSCULAR | Status: AC
  Administered 2018-08-07: 16:00:00 2 mL via INTRA_ARTICULAR

## 2018-08-14 DIAGNOSIS — M180 Bilateral primary osteoarthritis of first carpometacarpal joints: Secondary | ICD-10-CM | POA: Insufficient documentation

## 2018-08-14 DIAGNOSIS — M24132 Other articular cartilage disorders, left wrist: Secondary | ICD-10-CM | POA: Insufficient documentation

## 2018-08-14 DIAGNOSIS — S6990XA Unspecified injury of unspecified wrist, hand and finger(s), initial encounter: Secondary | ICD-10-CM | POA: Insufficient documentation

## 2019-02-18 ENCOUNTER — Ambulatory Visit: Payer: Medicare Other

## 2019-03-01 ENCOUNTER — Ambulatory Visit: Payer: Medicare Other

## 2019-04-19 DIAGNOSIS — M79661 Pain in right lower leg: Secondary | ICD-10-CM | POA: Insufficient documentation

## 2019-12-24 DIAGNOSIS — M25562 Pain in left knee: Secondary | ICD-10-CM | POA: Insufficient documentation

## 2020-01-25 DIAGNOSIS — E230 Hypopituitarism: Secondary | ICD-10-CM | POA: Diagnosis not present

## 2020-01-25 DIAGNOSIS — Z0000 Encounter for general adult medical examination without abnormal findings: Secondary | ICD-10-CM | POA: Diagnosis not present

## 2020-01-25 DIAGNOSIS — E039 Hypothyroidism, unspecified: Secondary | ICD-10-CM | POA: Diagnosis not present

## 2020-01-25 DIAGNOSIS — E785 Hyperlipidemia, unspecified: Secondary | ICD-10-CM | POA: Diagnosis not present

## 2020-01-25 DIAGNOSIS — M8580 Other specified disorders of bone density and structure, unspecified site: Secondary | ICD-10-CM | POA: Diagnosis not present

## 2020-01-25 DIAGNOSIS — Z79899 Other long term (current) drug therapy: Secondary | ICD-10-CM | POA: Diagnosis not present

## 2020-01-25 DIAGNOSIS — Z1389 Encounter for screening for other disorder: Secondary | ICD-10-CM | POA: Diagnosis not present

## 2020-02-01 DIAGNOSIS — E039 Hypothyroidism, unspecified: Secondary | ICD-10-CM | POA: Diagnosis not present

## 2020-02-01 DIAGNOSIS — E230 Hypopituitarism: Secondary | ICD-10-CM | POA: Diagnosis not present

## 2020-02-01 DIAGNOSIS — D352 Benign neoplasm of pituitary gland: Secondary | ICD-10-CM | POA: Diagnosis not present

## 2020-02-01 DIAGNOSIS — Z136 Encounter for screening for cardiovascular disorders: Secondary | ICD-10-CM | POA: Diagnosis not present

## 2020-02-01 DIAGNOSIS — M8580 Other specified disorders of bone density and structure, unspecified site: Secondary | ICD-10-CM | POA: Diagnosis not present

## 2020-02-01 DIAGNOSIS — E785 Hyperlipidemia, unspecified: Secondary | ICD-10-CM | POA: Diagnosis not present

## 2020-02-16 DIAGNOSIS — D352 Benign neoplasm of pituitary gland: Secondary | ICD-10-CM | POA: Diagnosis not present

## 2020-02-16 DIAGNOSIS — E230 Hypopituitarism: Secondary | ICD-10-CM | POA: Diagnosis not present

## 2020-02-16 DIAGNOSIS — M8580 Other specified disorders of bone density and structure, unspecified site: Secondary | ICD-10-CM | POA: Diagnosis not present

## 2020-02-21 DIAGNOSIS — Z961 Presence of intraocular lens: Secondary | ICD-10-CM | POA: Diagnosis not present

## 2020-02-21 DIAGNOSIS — H35371 Puckering of macula, right eye: Secondary | ICD-10-CM | POA: Diagnosis not present

## 2020-03-10 DIAGNOSIS — Z20822 Contact with and (suspected) exposure to covid-19: Secondary | ICD-10-CM | POA: Diagnosis not present

## 2020-03-16 ENCOUNTER — Other Ambulatory Visit: Payer: Self-pay | Admitting: Neurosurgery

## 2020-03-16 DIAGNOSIS — D352 Benign neoplasm of pituitary gland: Secondary | ICD-10-CM

## 2020-04-04 ENCOUNTER — Other Ambulatory Visit: Payer: Self-pay

## 2020-04-04 ENCOUNTER — Ambulatory Visit
Admission: RE | Admit: 2020-04-04 | Discharge: 2020-04-04 | Disposition: A | Discharge status: Home / Self Care | Payer: Medicare Other | Source: Ambulatory Visit | Attending: Neurosurgery | Admitting: Neurosurgery

## 2020-04-04 DIAGNOSIS — D352 Benign neoplasm of pituitary gland: Secondary | ICD-10-CM | POA: Diagnosis not present

## 2020-04-04 DIAGNOSIS — D496 Neoplasm of unspecified behavior of brain: Secondary | ICD-10-CM | POA: Diagnosis not present

## 2020-04-04 MED ORDER — GADOBENATE DIMEGLUMINE 529 MG/ML IV SOLN
9.0000 mL | Freq: Once | INTRAVENOUS | Status: AC | PRN
  Administered 2020-04-04: 9 mL via INTRAVENOUS

## 2020-04-15 DIAGNOSIS — E039 Hypothyroidism, unspecified: Secondary | ICD-10-CM | POA: Diagnosis not present

## 2020-04-15 DIAGNOSIS — M8580 Other specified disorders of bone density and structure, unspecified site: Secondary | ICD-10-CM | POA: Diagnosis not present

## 2020-04-15 DIAGNOSIS — E785 Hyperlipidemia, unspecified: Secondary | ICD-10-CM | POA: Diagnosis not present

## 2020-05-19 DIAGNOSIS — D352 Benign neoplasm of pituitary gland: Secondary | ICD-10-CM | POA: Diagnosis not present

## 2020-05-19 DIAGNOSIS — R030 Elevated blood-pressure reading, without diagnosis of hypertension: Secondary | ICD-10-CM | POA: Diagnosis not present

## 2020-06-12 DIAGNOSIS — E039 Hypothyroidism, unspecified: Secondary | ICD-10-CM | POA: Diagnosis not present

## 2020-06-12 DIAGNOSIS — M8580 Other specified disorders of bone density and structure, unspecified site: Secondary | ICD-10-CM | POA: Diagnosis not present

## 2020-06-12 DIAGNOSIS — E785 Hyperlipidemia, unspecified: Secondary | ICD-10-CM | POA: Diagnosis not present

## 2020-07-13 DIAGNOSIS — E785 Hyperlipidemia, unspecified: Secondary | ICD-10-CM | POA: Diagnosis not present

## 2020-07-13 DIAGNOSIS — E039 Hypothyroidism, unspecified: Secondary | ICD-10-CM | POA: Diagnosis not present

## 2020-07-13 DIAGNOSIS — M8580 Other specified disorders of bone density and structure, unspecified site: Secondary | ICD-10-CM | POA: Diagnosis not present

## 2020-08-15 DIAGNOSIS — E230 Hypopituitarism: Secondary | ICD-10-CM | POA: Diagnosis not present

## 2020-08-17 DIAGNOSIS — E230 Hypopituitarism: Secondary | ICD-10-CM | POA: Diagnosis not present

## 2020-08-17 DIAGNOSIS — M8580 Other specified disorders of bone density and structure, unspecified site: Secondary | ICD-10-CM | POA: Diagnosis not present

## 2020-08-17 DIAGNOSIS — D352 Benign neoplasm of pituitary gland: Secondary | ICD-10-CM | POA: Diagnosis not present

## 2020-09-21 DIAGNOSIS — H6121 Impacted cerumen, right ear: Secondary | ICD-10-CM | POA: Diagnosis not present

## 2020-09-21 DIAGNOSIS — H9201 Otalgia, right ear: Secondary | ICD-10-CM | POA: Diagnosis not present

## 2020-11-27 DIAGNOSIS — L918 Other hypertrophic disorders of the skin: Secondary | ICD-10-CM | POA: Diagnosis not present

## 2020-11-27 DIAGNOSIS — D485 Neoplasm of uncertain behavior of skin: Secondary | ICD-10-CM | POA: Diagnosis not present

## 2020-11-27 DIAGNOSIS — L821 Other seborrheic keratosis: Secondary | ICD-10-CM | POA: Diagnosis not present

## 2020-11-27 DIAGNOSIS — L570 Actinic keratosis: Secondary | ICD-10-CM | POA: Diagnosis not present

## 2021-01-29 DIAGNOSIS — M25562 Pain in left knee: Secondary | ICD-10-CM | POA: Diagnosis not present

## 2021-01-30 DIAGNOSIS — Z0001 Encounter for general adult medical examination with abnormal findings: Secondary | ICD-10-CM | POA: Diagnosis not present

## 2021-01-30 DIAGNOSIS — D352 Benign neoplasm of pituitary gland: Secondary | ICD-10-CM | POA: Diagnosis not present

## 2021-01-30 DIAGNOSIS — S8390XA Sprain of unspecified site of unspecified knee, initial encounter: Secondary | ICD-10-CM | POA: Diagnosis not present

## 2021-01-30 DIAGNOSIS — E785 Hyperlipidemia, unspecified: Secondary | ICD-10-CM | POA: Diagnosis not present

## 2021-01-30 DIAGNOSIS — Z800 Family history of malignant neoplasm of digestive organs: Secondary | ICD-10-CM | POA: Diagnosis not present

## 2021-01-30 DIAGNOSIS — M8580 Other specified disorders of bone density and structure, unspecified site: Secondary | ICD-10-CM | POA: Diagnosis not present

## 2021-01-30 DIAGNOSIS — E039 Hypothyroidism, unspecified: Secondary | ICD-10-CM | POA: Diagnosis not present

## 2021-01-30 DIAGNOSIS — E230 Hypopituitarism: Secondary | ICD-10-CM | POA: Diagnosis not present

## 2021-01-30 DIAGNOSIS — Z79899 Other long term (current) drug therapy: Secondary | ICD-10-CM | POA: Diagnosis not present

## 2021-02-06 DIAGNOSIS — M25562 Pain in left knee: Secondary | ICD-10-CM | POA: Diagnosis not present

## 2021-02-09 DIAGNOSIS — D352 Benign neoplasm of pituitary gland: Secondary | ICD-10-CM | POA: Diagnosis not present

## 2021-02-09 DIAGNOSIS — E230 Hypopituitarism: Secondary | ICD-10-CM | POA: Diagnosis not present

## 2021-02-09 DIAGNOSIS — M8580 Other specified disorders of bone density and structure, unspecified site: Secondary | ICD-10-CM | POA: Diagnosis not present

## 2021-02-12 DIAGNOSIS — S83242A Other tear of medial meniscus, current injury, left knee, initial encounter: Secondary | ICD-10-CM | POA: Diagnosis not present

## 2021-02-12 DIAGNOSIS — S83282A Other tear of lateral meniscus, current injury, left knee, initial encounter: Secondary | ICD-10-CM | POA: Diagnosis not present

## 2021-04-11 IMAGING — MR MR HEAD WO/W CM
14 of 19 series · 33 of 48 positions shown · IV contrast (multihance)
Comparison: 04/03/2018

CLINICAL DATA: Prior pituitary surgery 0644 and 5209.  Follow-up.

EXAM:
MRI HEAD WITHOUT AND WITH CONTRAST
TECHNIQUE: Multiplanar, multiecho pulse sequences of the brain and surrounding
structures were obtained without and with intravenous contrast.
CONTRAST:  9mL MULTIHANCE GADOBENATE DIMEGLUMINE 529 MG/ML IV SOLN

[Series 2: T1 · sagittal · 5.0mm · 0.47mm/px · 3 of 23 slices shown]
[im 1/23]
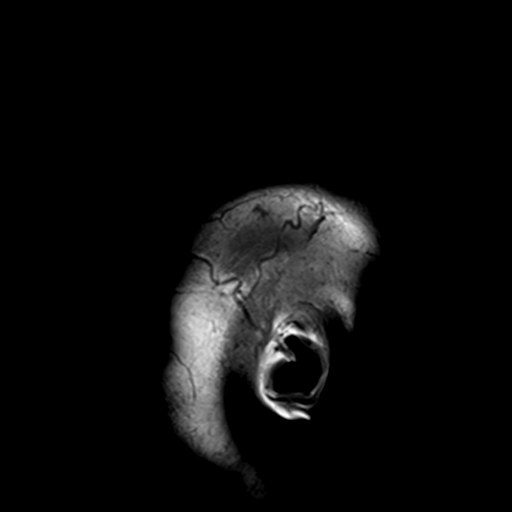
[im 12/23]
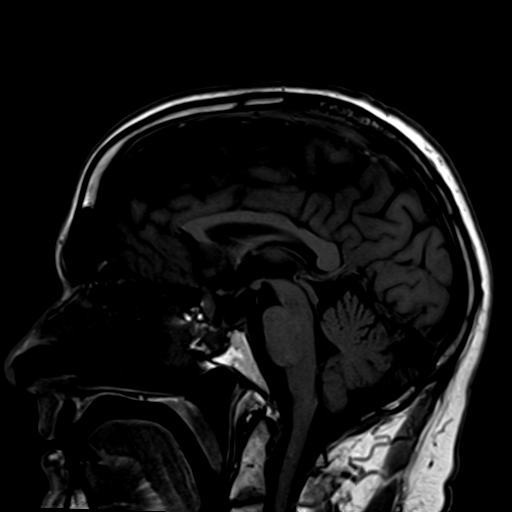
[im 23/23]
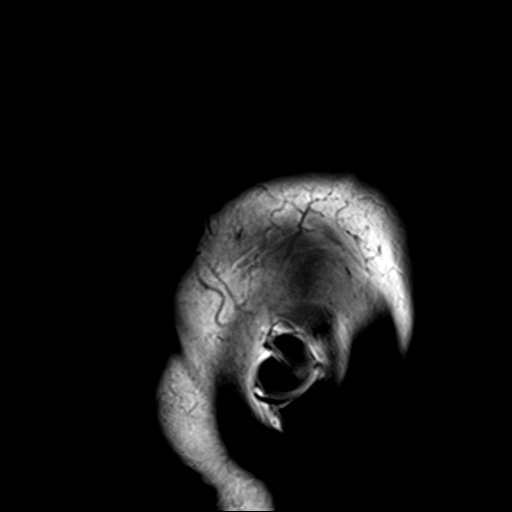

[Series 3: DWI · axial · 3.0mm · 1.80mm/px · z∈[-44,+110]mm · 8 of 106 slices shown]
[im 1/106]
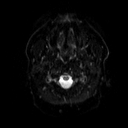
[im 12/106]
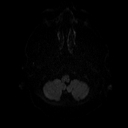
[im 36/106]
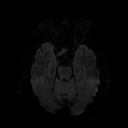
[im 47/106]
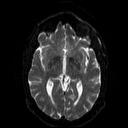
[im 59/106]
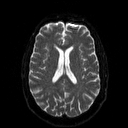
[im 71/106]
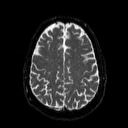
[im 94/106]
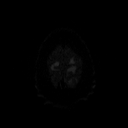
[im 106/106]
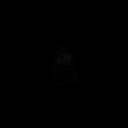

[Series 4: dwi_adc · axial · 3.0mm · 1.80mm/px · z∈[-44,+110]mm · 5 of 50 slices shown]
[im 1/50]
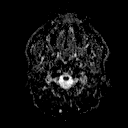
[im 13/50]
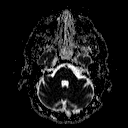
[im 25/50]
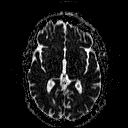
[im 37/50]
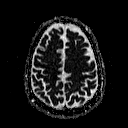
[im 50/50]
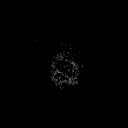

[Series 5: T2 · axial · 5.0mm · 0.36mm/px · z∈[-48,+114]mm · 2 of 26 slices shown]
[im 1/26]
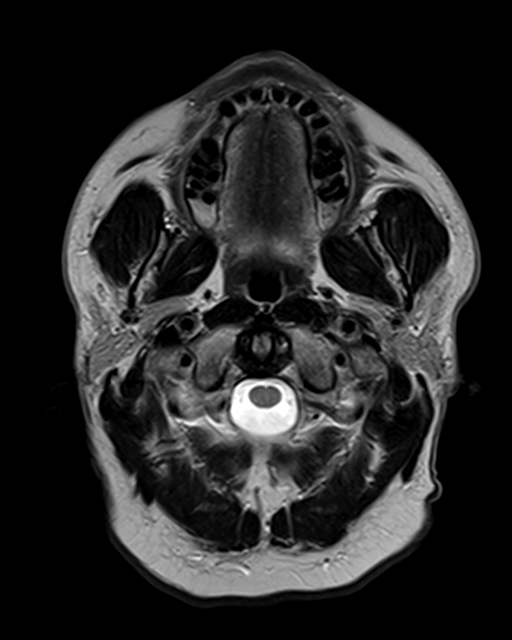
[im 26/26]
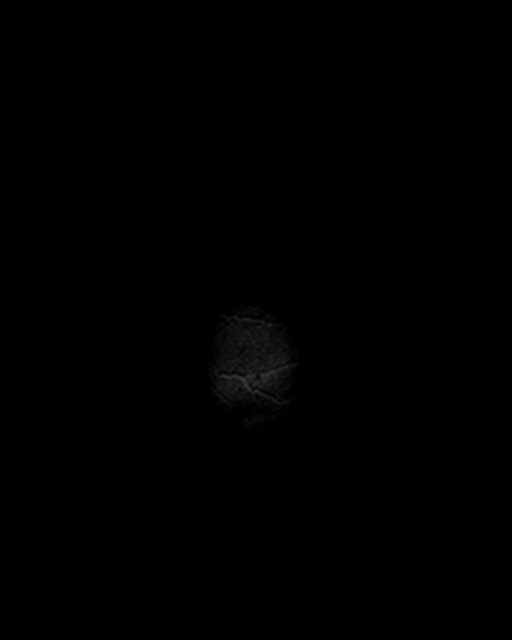

[Series 6: FLAIR · axial · 3.0mm · 0.45mm/px · z∈[-45,+111]mm · 3 of 35 slices shown]
[im 1/35]
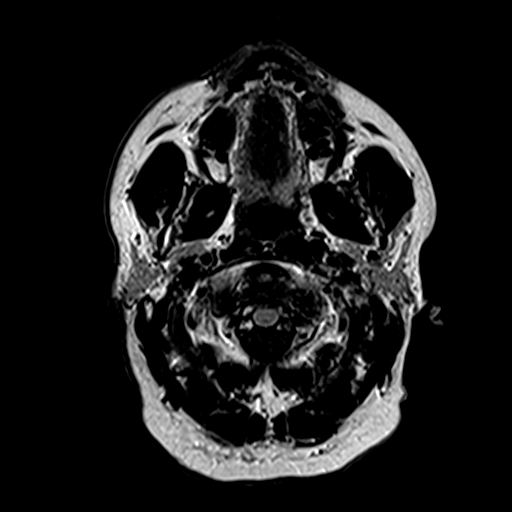
[im 18/35]
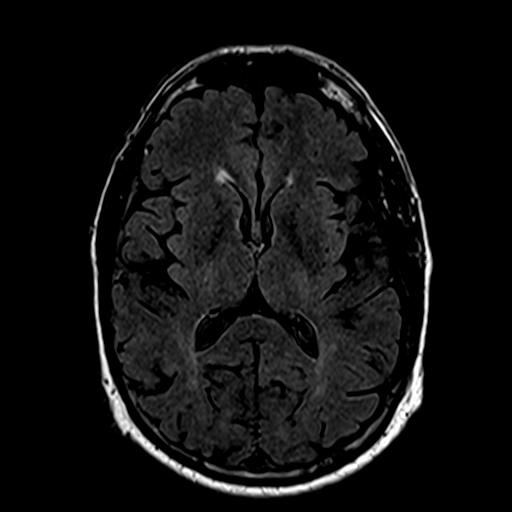
[im 35/35]
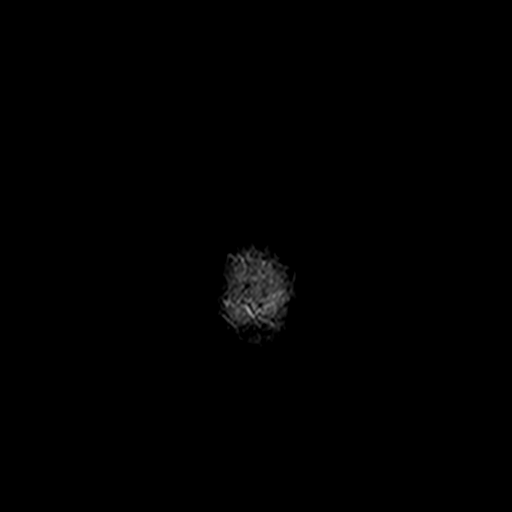

[Series 8: swi_images · axial · 4.0mm · 0.94mm/px · z∈[-44,+111]mm · 4 of 40 slices shown]
[im 1/40]
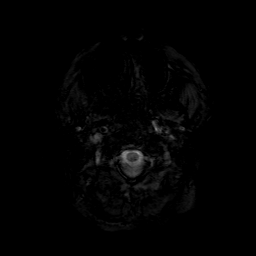
[im 14/40]
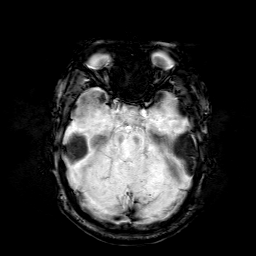
[im 27/40]
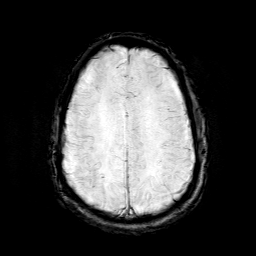
[im 40/40]
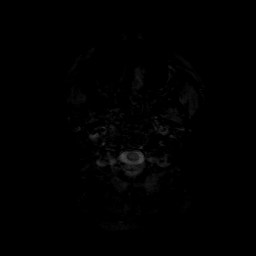

[Series 9: sag 3mm · sagittal · 3.0mm · 0.33mm/px · 1 of 13 slices shown]
[im 1/13]
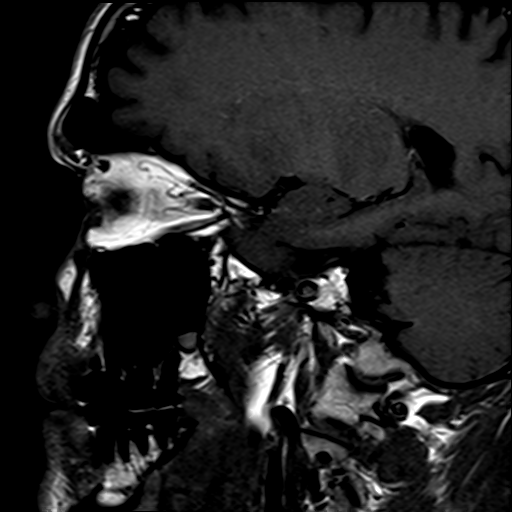

[Series 10: cor 3mm · coronal · 3.0mm · 0.33mm/px · 1 of 13 slices shown]
[im 1/13]
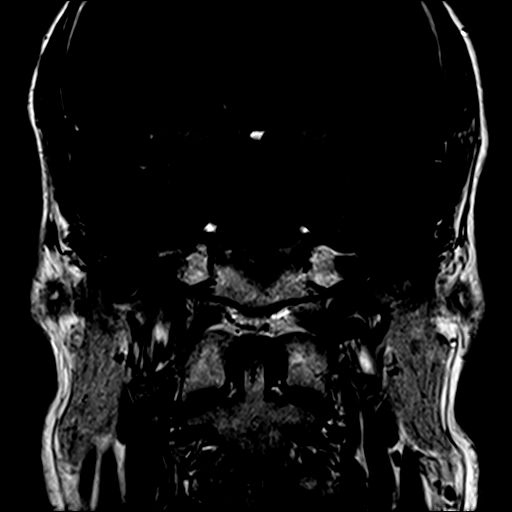

[Series 11: pre cor dynamic · coronal · non-contrast · 3.0mm · 0.35mm/px · 1 of 10 slices shown]
[im 1/10]
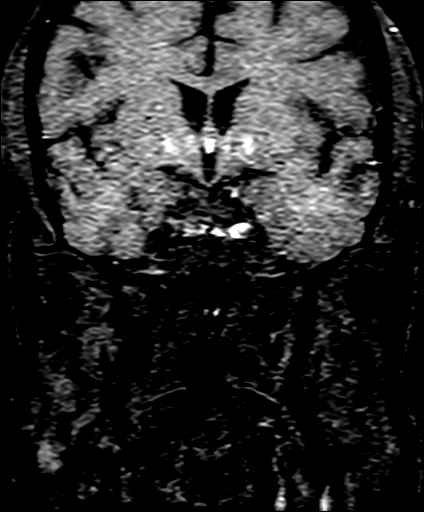

[Series 12: post fs cor · coronal · 3.0mm · 0.35mm/px · 1 of 10 slices shown (1 of 5)]
[im 1/10]
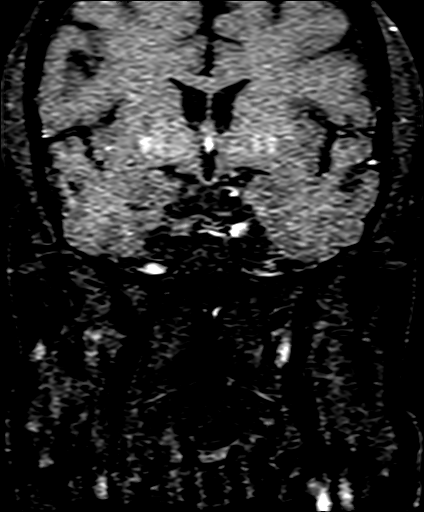

[Series 13: post fs cor · coronal · 3.0mm · 0.35mm/px · 1 of 10 slices shown (2 of 5)]
[im 1/10]
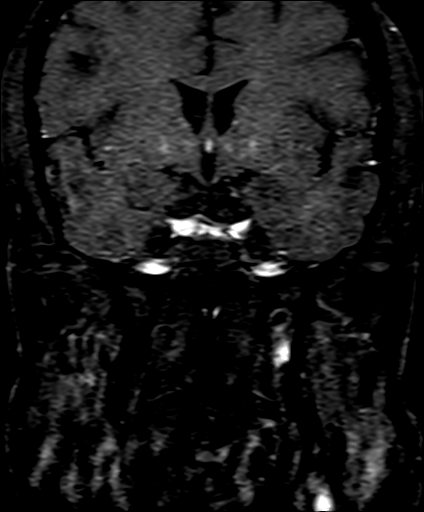

[Series 14: post fs cor · coronal · 3.0mm · 0.35mm/px · 1 of 10 slices shown (3 of 5)]
[im 1/10]
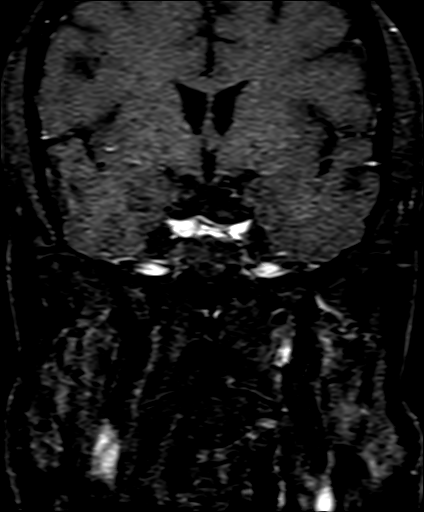

[Series 15: post fs cor · coronal · 3.0mm · 0.35mm/px · 1 of 10 slices shown (4 of 5)]
[im 1/10]
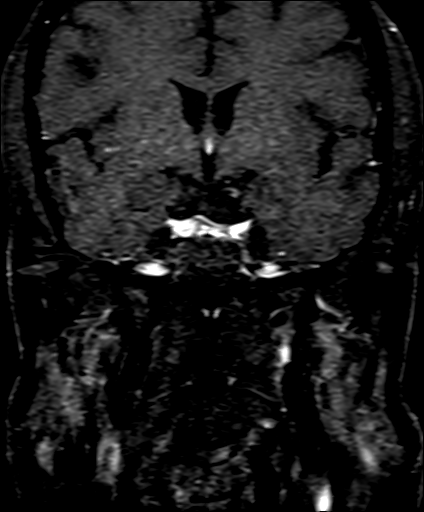

[Series 16: post fs cor · coronal · 3.0mm · 0.35mm/px · 1 of 10 slices shown (5 of 5)]
[im 1/10]
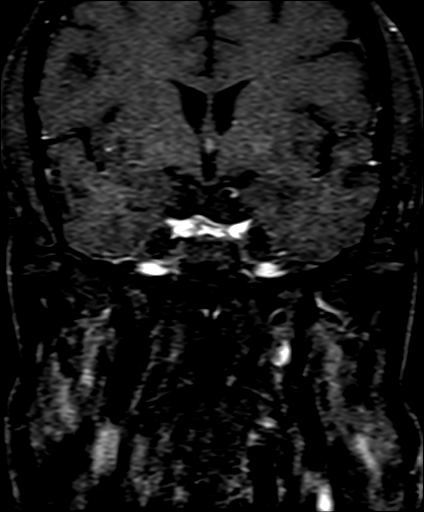

[33 of 48 positions shown; findings below may reference images not displayed]

FINDINGS: Brain: The brain itself is normal for age without evidence of
accelerated atrophy, recent infarction, mass lesion, hemorrhage,
hydrocephalus or extra-axial collection. A few punctate foci of T2
and FLAIR signal within the hemispheric white matter are noted, less
than often seen at this age. No intra-axial mass, hemorrhage,
hydrocephalus or extra-axial collection.

There is been previous trans-sphenoidal surgery. Residual pituitary
tumor in the right anterior sella with extension into the right
cavernous sinus shows the most minimal increase in volume, maximal
transverse dimension measuring 10.4 mm today compared with 9.5 mm
previously. Additionally, I think there is a slight increase in
tissue in the left lateral margin of the sella, possibly with early
involvement of the left cavernous sinus. These represent very
minimal changes and can probably be followed. There is no dramatic
change.

Vascular: Major vessels at the base of the brain show flow. Partial
encasement of the distal siphon on the right as seen previously.

Skull and upper cervical spine: No new finding.

Sinuses/Orbits: Clear/normal

Other: None
IMPRESSION: Previous trans-sphenoidal surgery. Residual pituitary tumor in the
right anterior sella with extension into the right cavernous sinus
shows the most minimal increase in volume, maximal transverse
dimension measuring 10.4 mm today compared with 9.5 mm previously.
Additionally, there is a slight increase in tissue in the left
lateral margin of the sella, possibly with early involvement of the
left cavernous sinus. These represent very minimal changes, and are
even debatable, and can probably be followed.

## 2021-04-13 DIAGNOSIS — H10411 Chronic giant papillary conjunctivitis, right eye: Secondary | ICD-10-CM | POA: Diagnosis not present

## 2021-04-13 DIAGNOSIS — H0100B Unspecified blepharitis left eye, upper and lower eyelids: Secondary | ICD-10-CM | POA: Diagnosis not present

## 2021-04-13 DIAGNOSIS — H0100A Unspecified blepharitis right eye, upper and lower eyelids: Secondary | ICD-10-CM | POA: Diagnosis not present

## 2021-04-13 DIAGNOSIS — H26491 Other secondary cataract, right eye: Secondary | ICD-10-CM | POA: Diagnosis not present

## 2021-04-16 DIAGNOSIS — H26491 Other secondary cataract, right eye: Secondary | ICD-10-CM | POA: Diagnosis not present

## 2021-08-08 DIAGNOSIS — E039 Hypothyroidism, unspecified: Secondary | ICD-10-CM | POA: Diagnosis not present

## 2021-08-10 DIAGNOSIS — D352 Benign neoplasm of pituitary gland: Secondary | ICD-10-CM | POA: Diagnosis not present

## 2021-08-10 DIAGNOSIS — E230 Hypopituitarism: Secondary | ICD-10-CM | POA: Diagnosis not present

## 2021-08-10 DIAGNOSIS — M8580 Other specified disorders of bone density and structure, unspecified site: Secondary | ICD-10-CM | POA: Diagnosis not present

## 2021-11-27 DIAGNOSIS — B354 Tinea corporis: Secondary | ICD-10-CM | POA: Diagnosis not present

## 2021-11-27 DIAGNOSIS — L570 Actinic keratosis: Secondary | ICD-10-CM | POA: Diagnosis not present

## 2021-11-27 DIAGNOSIS — L821 Other seborrheic keratosis: Secondary | ICD-10-CM | POA: Diagnosis not present

## 2022-01-28 DIAGNOSIS — D352 Benign neoplasm of pituitary gland: Secondary | ICD-10-CM | POA: Diagnosis not present

## 2022-01-28 DIAGNOSIS — E785 Hyperlipidemia, unspecified: Secondary | ICD-10-CM | POA: Diagnosis not present

## 2022-01-28 DIAGNOSIS — Z125 Encounter for screening for malignant neoplasm of prostate: Secondary | ICD-10-CM | POA: Diagnosis not present

## 2022-01-28 DIAGNOSIS — E230 Hypopituitarism: Secondary | ICD-10-CM | POA: Diagnosis not present

## 2022-02-05 DIAGNOSIS — Z125 Encounter for screening for malignant neoplasm of prostate: Secondary | ICD-10-CM | POA: Diagnosis not present

## 2022-02-05 DIAGNOSIS — D352 Benign neoplasm of pituitary gland: Secondary | ICD-10-CM | POA: Diagnosis not present

## 2022-02-05 DIAGNOSIS — E230 Hypopituitarism: Secondary | ICD-10-CM | POA: Diagnosis not present

## 2022-02-05 DIAGNOSIS — M8580 Other specified disorders of bone density and structure, unspecified site: Secondary | ICD-10-CM | POA: Diagnosis not present

## 2022-02-25 DIAGNOSIS — R69 Illness, unspecified: Secondary | ICD-10-CM | POA: Diagnosis not present

## 2022-02-27 DIAGNOSIS — R69 Illness, unspecified: Secondary | ICD-10-CM | POA: Diagnosis not present

## 2022-02-28 DIAGNOSIS — R69 Illness, unspecified: Secondary | ICD-10-CM | POA: Diagnosis not present

## 2022-03-06 DIAGNOSIS — R69 Illness, unspecified: Secondary | ICD-10-CM | POA: Diagnosis not present

## 2022-03-07 DIAGNOSIS — R69 Illness, unspecified: Secondary | ICD-10-CM | POA: Diagnosis not present

## 2022-03-08 DIAGNOSIS — R69 Illness, unspecified: Secondary | ICD-10-CM | POA: Diagnosis not present

## 2022-03-13 DIAGNOSIS — R69 Illness, unspecified: Secondary | ICD-10-CM | POA: Diagnosis not present

## 2022-03-14 DIAGNOSIS — R69 Illness, unspecified: Secondary | ICD-10-CM | POA: Diagnosis not present

## 2022-03-18 DIAGNOSIS — R69 Illness, unspecified: Secondary | ICD-10-CM | POA: Diagnosis not present

## 2022-03-20 DIAGNOSIS — R69 Illness, unspecified: Secondary | ICD-10-CM | POA: Diagnosis not present

## 2022-04-22 DIAGNOSIS — R69 Illness, unspecified: Secondary | ICD-10-CM | POA: Diagnosis not present

## 2022-04-23 DIAGNOSIS — R69 Illness, unspecified: Secondary | ICD-10-CM | POA: Diagnosis not present

## 2022-04-24 DIAGNOSIS — Z961 Presence of intraocular lens: Secondary | ICD-10-CM | POA: Diagnosis not present

## 2022-04-24 DIAGNOSIS — H26492 Other secondary cataract, left eye: Secondary | ICD-10-CM | POA: Diagnosis not present

## 2022-04-24 DIAGNOSIS — H35371 Puckering of macula, right eye: Secondary | ICD-10-CM | POA: Diagnosis not present

## 2022-04-25 DIAGNOSIS — R69 Illness, unspecified: Secondary | ICD-10-CM | POA: Diagnosis not present

## 2022-04-26 DIAGNOSIS — R69 Illness, unspecified: Secondary | ICD-10-CM | POA: Diagnosis not present

## 2022-04-29 DIAGNOSIS — R69 Illness, unspecified: Secondary | ICD-10-CM | POA: Diagnosis not present

## 2022-05-02 DIAGNOSIS — R69 Illness, unspecified: Secondary | ICD-10-CM | POA: Diagnosis not present

## 2022-05-07 DIAGNOSIS — R69 Illness, unspecified: Secondary | ICD-10-CM | POA: Diagnosis not present

## 2022-05-08 ENCOUNTER — Other Ambulatory Visit: Payer: Self-pay | Admitting: Neurosurgery

## 2022-05-08 DIAGNOSIS — R69 Illness, unspecified: Secondary | ICD-10-CM | POA: Diagnosis not present

## 2022-05-08 DIAGNOSIS — D352 Benign neoplasm of pituitary gland: Secondary | ICD-10-CM

## 2022-05-10 DIAGNOSIS — Z125 Encounter for screening for malignant neoplasm of prostate: Secondary | ICD-10-CM | POA: Diagnosis not present

## 2022-05-10 DIAGNOSIS — Z23 Encounter for immunization: Secondary | ICD-10-CM | POA: Diagnosis not present

## 2022-05-10 DIAGNOSIS — E230 Hypopituitarism: Secondary | ICD-10-CM | POA: Diagnosis not present

## 2022-05-10 DIAGNOSIS — E039 Hypothyroidism, unspecified: Secondary | ICD-10-CM | POA: Diagnosis not present

## 2022-05-10 DIAGNOSIS — E291 Testicular hypofunction: Secondary | ICD-10-CM | POA: Diagnosis not present

## 2022-05-10 DIAGNOSIS — Z86018 Personal history of other benign neoplasm: Secondary | ICD-10-CM | POA: Diagnosis not present

## 2022-05-10 DIAGNOSIS — Z79899 Other long term (current) drug therapy: Secondary | ICD-10-CM | POA: Diagnosis not present

## 2022-05-10 DIAGNOSIS — Z0000 Encounter for general adult medical examination without abnormal findings: Secondary | ICD-10-CM | POA: Diagnosis not present

## 2022-05-10 DIAGNOSIS — M8580 Other specified disorders of bone density and structure, unspecified site: Secondary | ICD-10-CM | POA: Diagnosis not present

## 2022-05-10 DIAGNOSIS — E785 Hyperlipidemia, unspecified: Secondary | ICD-10-CM | POA: Diagnosis not present

## 2022-05-10 DIAGNOSIS — N529 Male erectile dysfunction, unspecified: Secondary | ICD-10-CM | POA: Diagnosis not present

## 2022-05-10 DIAGNOSIS — Z1331 Encounter for screening for depression: Secondary | ICD-10-CM | POA: Diagnosis not present

## 2022-05-13 DIAGNOSIS — R69 Illness, unspecified: Secondary | ICD-10-CM | POA: Diagnosis not present

## 2022-05-19 DIAGNOSIS — R69 Illness, unspecified: Secondary | ICD-10-CM | POA: Diagnosis not present

## 2022-05-20 DIAGNOSIS — R69 Illness, unspecified: Secondary | ICD-10-CM | POA: Diagnosis not present

## 2022-05-21 DIAGNOSIS — R69 Illness, unspecified: Secondary | ICD-10-CM | POA: Diagnosis not present

## 2022-05-29 ENCOUNTER — Ambulatory Visit
Admission: RE | Admit: 2022-05-29 | Discharge: 2022-05-29 | Disposition: A | Discharge status: Home / Self Care | Payer: Medicare HMO | Source: Ambulatory Visit | Attending: Neurosurgery | Admitting: Neurosurgery

## 2022-05-29 DIAGNOSIS — D352 Benign neoplasm of pituitary gland: Secondary | ICD-10-CM

## 2022-05-29 MED ORDER — GADOPICLENOL 0.5 MMOL/ML IV SOLN
9.0000 mL | Freq: Once | INTRAVENOUS | Status: AC | PRN
  Administered 2022-05-29: 9 mL via INTRAVENOUS

## 2022-06-21 DIAGNOSIS — D352 Benign neoplasm of pituitary gland: Secondary | ICD-10-CM | POA: Diagnosis not present

## 2022-07-01 DIAGNOSIS — R69 Illness, unspecified: Secondary | ICD-10-CM | POA: Diagnosis not present

## 2022-07-02 DIAGNOSIS — R69 Illness, unspecified: Secondary | ICD-10-CM | POA: Diagnosis not present

## 2022-07-03 DIAGNOSIS — R69 Illness, unspecified: Secondary | ICD-10-CM | POA: Diagnosis not present

## 2022-07-05 DIAGNOSIS — R69 Illness, unspecified: Secondary | ICD-10-CM | POA: Diagnosis not present

## 2022-07-15 DIAGNOSIS — R69 Illness, unspecified: Secondary | ICD-10-CM | POA: Diagnosis not present

## 2022-07-16 DIAGNOSIS — R69 Illness, unspecified: Secondary | ICD-10-CM | POA: Diagnosis not present

## 2022-07-17 DIAGNOSIS — R69 Illness, unspecified: Secondary | ICD-10-CM | POA: Diagnosis not present

## 2022-07-19 DIAGNOSIS — R69 Illness, unspecified: Secondary | ICD-10-CM | POA: Diagnosis not present

## 2022-07-22 DIAGNOSIS — R69 Illness, unspecified: Secondary | ICD-10-CM | POA: Diagnosis not present

## 2022-07-23 DIAGNOSIS — R69 Illness, unspecified: Secondary | ICD-10-CM | POA: Diagnosis not present

## 2022-08-21 DIAGNOSIS — D352 Benign neoplasm of pituitary gland: Secondary | ICD-10-CM | POA: Diagnosis not present

## 2022-08-23 ENCOUNTER — Other Ambulatory Visit: Payer: Self-pay | Admitting: Radiation Therapy

## 2022-08-26 DIAGNOSIS — E230 Hypopituitarism: Secondary | ICD-10-CM | POA: Diagnosis not present

## 2022-08-26 DIAGNOSIS — E039 Hypothyroidism, unspecified: Secondary | ICD-10-CM | POA: Diagnosis not present

## 2022-08-29 DIAGNOSIS — M8580 Other specified disorders of bone density and structure, unspecified site: Secondary | ICD-10-CM | POA: Diagnosis not present

## 2022-08-29 DIAGNOSIS — D352 Benign neoplasm of pituitary gland: Secondary | ICD-10-CM | POA: Diagnosis not present

## 2022-08-29 DIAGNOSIS — Z125 Encounter for screening for malignant neoplasm of prostate: Secondary | ICD-10-CM | POA: Diagnosis not present

## 2022-08-29 DIAGNOSIS — E230 Hypopituitarism: Secondary | ICD-10-CM | POA: Diagnosis not present

## 2022-09-02 ENCOUNTER — Other Ambulatory Visit: Payer: Self-pay | Admitting: Radiation Therapy

## 2022-09-02 ENCOUNTER — Inpatient Hospital Stay: Payer: Medicare HMO | Attending: Neurological Surgery

## 2022-09-02 DIAGNOSIS — D352 Benign neoplasm of pituitary gland: Secondary | ICD-10-CM

## 2022-09-03 ENCOUNTER — Other Ambulatory Visit: Payer: Self-pay | Admitting: Radiation Therapy

## 2022-09-03 DIAGNOSIS — D352 Benign neoplasm of pituitary gland: Secondary | ICD-10-CM

## 2022-09-04 ENCOUNTER — Ambulatory Visit
Admission: RE | Admit: 2022-09-04 | Discharge: 2022-09-04 | Disposition: A | Discharge status: Home / Self Care | Payer: Medicare HMO | Source: Ambulatory Visit | Attending: Nurse Practitioner | Admitting: Nurse Practitioner

## 2022-09-04 ENCOUNTER — Other Ambulatory Visit: Payer: Self-pay

## 2022-09-04 DIAGNOSIS — D352 Benign neoplasm of pituitary gland: Secondary | ICD-10-CM | POA: Insufficient documentation

## 2022-09-04 LAB — BUN & CREATININE (CHCC)
BUN: 26 mg/dL — ABNORMAL HIGH (ref 8–23)
Creatinine: 1.22 mg/dL (ref 0.61–1.24)
GFR, Estimated: >60 mL/min (ref >60)

## 2022-09-16 DIAGNOSIS — D352 Benign neoplasm of pituitary gland: Secondary | ICD-10-CM | POA: Diagnosis not present

## 2022-09-16 NOTE — Progress Notes (Signed)
Location/Histology of Brain Tumor: Recurrent Pituitary Adenoma   05/29/2022 Dr. Tressie Stalker MR Brain with/without Contrast CLINICAL DATA:  72 year old male with a history of previous pituitary surgery, pituitary adenoma resected in 2011 and again in 2017. Slowly enlarging residual in 2022.  IMPRESSION: 1. Progressed since 2022 of recurrent Pituitary Adenoma. 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 Early involvement of the right cavernous sinus, and pre chiasmatic optic nerves are now abutted. But no significant regional mass effect at this time, and stable heterogeneous postoperative appearance of both sphenoid paranasal sinuses.   2. Otherwise negative for age MRI appearance of the brain.  04/04/2020 Dr. Tressie Stalker MR Brain with/without Contrast CLINICAL DATA: Prior pituitary surgery 2011 and 2017. Follow-up   IMPRESSION: Previous trans-sphenoidal surgery. Residual pituitary tumor in the right anterior sella with extension into the right cavernous sinus shows the most minimal increase in volume, maximal transverse dimension measuring 10.4 mm today compared with 9.5 mm previously. Additionally, there is a slight increase in tissue in the left lateral margin of the sella, possibly with early involvement of the left cavernous sinus. These represent very minimal changes, and are even debatable, and can probably be followed.  04/03/2018 Dr. Tressie Stalker MR Brain with/without Contrast CLINICAL DATA: Pituitary adenoma with extra sellar extension   IMPRESSION: Residual enhancing tumor in the right sella is stable from the prior study.   Past or anticipated interventions, if any, per neurosurgery: NA  Past or anticipated interventions, if any, per medical oncology: NA  Dose of Decadron, if applicable: No  Recent neurologic  symptoms, if any:  Seizures: {:18581} Headaches: {:18581} Nausea: {:18581} Dizziness/ataxia: {:18581} Difficulty with hand coordination: {:18581} Focal numbness/weakness: {:18581} Visual deficits/changes: {:18581} Confusion/Memory deficits: {:18581}  Painful bone metastases at present, if any: {:18581}  SAFETY ISSUES: Prior radiation? {:18581} Pacemaker/ICD? {:18581} Possible current pregnancy? Male Is the patient on methotrexate? No  Additional Complaints / other details:

## 2022-09-17 ENCOUNTER — Ambulatory Visit
Admission: RE | Admit: 2022-09-17 | Discharge: 2022-09-17 | Disposition: A | Discharge status: Home / Self Care | Payer: Medicare HMO | Source: Ambulatory Visit | Attending: Radiation Oncology | Admitting: Radiation Oncology

## 2022-09-17 DIAGNOSIS — D352 Benign neoplasm of pituitary gland: Secondary | ICD-10-CM

## 2022-09-17 MED ORDER — GADOPICLENOL 0.5 MMOL/ML IV SOLN
10.0000 mL | Freq: Once | INTRAVENOUS | Status: AC | PRN
  Administered 2022-09-17: 10 mL via INTRAVENOUS

## 2022-09-18 ENCOUNTER — Ambulatory Visit
Admission: RE | Admit: 2022-09-18 | Discharge: 2022-09-18 | Disposition: A | Discharge status: Home / Self Care | Payer: Medicare HMO | Source: Ambulatory Visit | Attending: Radiation Oncology | Admitting: Radiation Oncology

## 2022-09-18 ENCOUNTER — Ambulatory Visit
Admission: RE | Admit: 2022-09-18 | Discharge: 2022-09-18 | Disposition: A | Discharge status: Home / Self Care | Payer: Medicare HMO | Source: Ambulatory Visit | Attending: Radiation Oncology

## 2022-09-18 ENCOUNTER — Encounter: Payer: Self-pay | Admitting: Radiation Oncology

## 2022-09-18 VITALS — BP 119/82 | HR 75 | Temp 97.3°F | Resp 18 | Ht 71.75 in | Wt 193.5 lb

## 2022-09-18 DIAGNOSIS — E2740 Unspecified adrenocortical insufficiency: Secondary | ICD-10-CM | POA: Insufficient documentation

## 2022-09-18 DIAGNOSIS — M129 Arthropathy, unspecified: Secondary | ICD-10-CM | POA: Insufficient documentation

## 2022-09-18 DIAGNOSIS — Z7982 Long term (current) use of aspirin: Secondary | ICD-10-CM | POA: Diagnosis not present

## 2022-09-18 DIAGNOSIS — E039 Hypothyroidism, unspecified: Secondary | ICD-10-CM | POA: Diagnosis not present

## 2022-09-18 DIAGNOSIS — Z800 Family history of malignant neoplasm of digestive organs: Secondary | ICD-10-CM | POA: Insufficient documentation

## 2022-09-18 DIAGNOSIS — Z923 Personal history of irradiation: Secondary | ICD-10-CM | POA: Diagnosis not present

## 2022-09-18 DIAGNOSIS — D352 Benign neoplasm of pituitary gland: Secondary | ICD-10-CM

## 2022-09-18 DIAGNOSIS — Z79899 Other long term (current) drug therapy: Secondary | ICD-10-CM | POA: Diagnosis not present

## 2022-09-18 DIAGNOSIS — E2749 Other adrenocortical insufficiency: Secondary | ICD-10-CM

## 2022-09-18 MED ORDER — SODIUM CHLORIDE 0.9% FLUSH
10.0000 mL | INTRAVENOUS | Status: DC | PRN
  Administered 2022-09-18: 10 mL via INTRAVENOUS

## 2022-09-18 NOTE — Progress Notes (Signed)
Has armband been applied?  Yes.    Does patient have an allergy to IV contrast dye?: No.   Has patient ever received premedication for IV contrast dye?: No.   Does patient take metformin?: No.  If patient does take metformin when was the last dose: N/A  Date of lab work: September 04, 2022 BUN: 26 CR: 1.22 eGfr: >60  IV site: antecubital left, condition patent and no redness  Has IV site been added to flowsheet?  Yes.    There were no vitals taken for this visit.  This concludes the interaction.  Ruel Favors, LPN

## 2022-09-18 NOTE — Progress Notes (Signed)
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 ***  The encounter diagnosis was Benign neoplasm of pituitary gland (HCC).    ICD-10-CM   1. Benign neoplasm of pituitary gland (HCC)  D35.2       HISTORY OF PRESENT ILLNESS::Seth Estrada is a 72 y.o. male who is being seen at the request of Dr. Maurice Small. He was originally diagnosed with a pituitary adenoma in 2011. He underwent a transsphenoidal resection in 2011 with Dr. Ezzard Standing and Dr. Lovell Sheehan. Follow-up MRI 04/25/2015 demonstrated a recurrence of the tumor. Dr. Lovell Sheehan repeated the transsphenoidal resection of the pituitary tumor on 06/15/15.   MRI of the brain from 05/29/22 showed enhancing soft tissue nodularity consistent with a macroadenoma that had progressed since his 04/04/20. This is located along the right floor of the sella turcica, especially along the anterior portion now tracking across the midline to the left. Enhancing tissue now encompasses 16 x 19 x 24 mm. It measured 10 x 15 x 15 mm in 2022. Early involvement of the right cavernous sinus was visualized. The anterior superior extend of the tumor was also noted to abut the prechiasmatic optic nerves.   3T brain MRI on 09/17/22 confirmed sellar/suprasellar mass compatible with macroadenoma. It was similar in size when compared to brain MRI in May of 2024.   He was kindly referred to [place] today to discuss radiation therapy options.    PREVIOUS RADIATION THERAPY: No  Past Medical History:  Diagnosis Date   Adrenal insufficiency (HCC)    secondary   Arthritis    Hypothyroidism    Pituitary adenoma (HCC)    Seasonal allergies   :   Past Surgical History:   Procedure Laterality Date   CRANIOTOMY N/A 06/15/2015   Procedure: CRANIOTOMY HYPOPHYSECTOMY TRANSNASAL APPROACH Transphenoidal resection of pituitary tumor ;  Surgeon: Tressie Stalker, MD;  Location: [facility];  Service: Neurosurgery;  Laterality: N/A;   HAND SURGERY Left 2000   HERNIA REPAIR Left 1991   PITUITARY EXCISION  02/2009   Tressie Stalker   SHOULDER SURGERY  [id number]   bilateral rotator cuff   TRANSNASAL APPROACH N/A 06/15/2015   Procedure: TRANSNASAL APPROACH;  Surgeon: Drema Halon, MD;  Location: [facility];  Service: ENT;  Laterality: N/A;   TURBINATE REDUCTION Bilateral 06/15/2015   Procedure: BILATERAL TURBINATE REDUCTION;  Surgeon: Drema Halon, MD;  Location: [facility];  Service: ENT;  Laterality: Bilateral;  :   Current Outpatient Medications:    BOOSTRIX 5-2.5-18.5 LF-MCG/0.5 injection, , Disp: , Rfl:    SHINGRIX injection, , Disp: , Rfl:    aspirin EC 81 MG tablet, Take 1 tablet (81 mg total) by mouth daily., Disp: , Rfl:    fexofenadine (ALLEGRA) 180 MG tablet, Take 180 mg by mouth daily., Disp: , Rfl:    fluticasone (FLONASE) 50 MCG/ACT nasal spray, Place 1 spray into both nostrils daily., Disp: , Rfl:    hydrocortisone (CORTEF) 10 MG tablet, Take 10-15 mg by mouth 2 (two) times daily. 15 mg in the morning 10mg  in the evening, Disp: , Rfl: 4   hydrocortisone sodium succinate (SOLU-CORTEF) 100 MG injection, 100 mg., Disp: , Rfl:    levothyroxine (SYNTHROID, LEVOTHROID) 50  MCG tablet, Take 50 mcg by mouth., Disp: , Rfl:    meloxicam (MOBIC) 15 MG tablet, 15 mg., Disp: , Rfl:    rosuvastatin (CRESTOR) 5 MG tablet, Take 5 mg by mouth at bedtime. , Disp: , Rfl:    testosterone cypionate (DEPOTESTOTERONE CYPIONATE) 200 MG/ML injection, Inject 0.6 mLs into the muscle every 14 (fourteen) days., Disp: , Rfl: 3 No current facility-administered medications for this encounter.  Facility-Administered Medications Ordered in Other Encounters:    sodium  chloride flush (NS) 0.9 % injection 10 mL, 10 mL, Intravenous, PRN, Margaretmary Dys, MD, 10 mL at 09/18/22 1423:   Allergies  Allergen Reactions   Garlic Other (See Comments)    "Makes me feel lousy"  :   Family History  Problem Relation Age of Onset   CAD Mother    Colon cancer Father   :   Social History   Socioeconomic History   Marital status: Married    Spouse name: Not on file   Number of children: Not on file   Years of education: Not on file   Highest education level: Not on file  Occupational History   Not on file  Tobacco Use   Smoking status: Never   Smokeless tobacco: Never  Vaping Use   Vaping status: Never Used  Substance and Sexual Activity   Alcohol use: No   Drug use: No   Sexual activity: Yes  Other Topics Concern   Not on file  Social History Narrative   Not on file   Social Determinants of Health   Financial Resource Strain: Not on file  Food Insecurity: No Food Insecurity (09/18/2022)   Hunger Vital Sign    Worried About Running Out of Food in the Last Year: Never true    Ran Out of Food in the Last Year: Never true  Transportation Needs: No Transportation Needs (09/18/2022)   PRAPARE - Administrator, Civil Service (Medical): No    Lack of Transportation (Non-Medical): No  Physical Activity: Not on file  Stress: Not on file  Social Connections: Unknown (06/03/2021)   [facility]   Social Network    Social Network: Not on file  Intimate Partner Violence: Not At Risk (09/18/2022)   Humiliation, Afraid, Rape, and Kick questionnaire    Fear of Current or Ex-Partner: No    Emotionally Abused: No    Physically Abused: No    Sexually Abused: No    REVIEW OF SYSTEMS:  He denies any headaches, numbness, tingling,     PHYSICAL EXAM:  Blood pressure 119/82, pulse 75, temperature (!) 97.3 F (36.3 C), temperature source Temporal, resp. rate 18, height 5' 11.75" (1.822 m), weight 193 lb 8 oz (87.8  kg), peak flow 97 L/min. In general this is a well appearing male in no acute distress. She's alert and oriented x4 and appropriate throughout the examination. Cardiopulmonary assessment is negative for acute distress and she exhibits normal effort.      KPS = 100  100 - Normal; no complaints; no evidence of disease. 90   - Able to carry on normal activity; minor signs or symptoms of disease. 80   - Normal activity with effort; some signs or symptoms of disease. 6   - Cares for self; unable to carry on normal activity or to do active work. 60   - Requires occasional assistance, but is able to care for most of his personal needs. 50   - Requires  considerable assistance and frequent medical care. 40   - Disabled; requires special care and assistance. 30   - Severely disabled; hospital admission is indicated although death not imminent. 20   - Very sick; hospital admission necessary; active supportive treatment necessary. 10   - Moribund; fatal processes progressing rapidly. 0     - Dead  Karnofsky DA, Abelmann WH, Craver LS and Burchenal [facility] [phone]) The use of the nitrogen mustards in the palliative treatment of carcinoma: with particular reference to bronchogenic carcinoma Cancer 1 634-56  LABORATORY DATA:  Lab Results  Component Value Date   WBC 11.0 (H) 03/25/2016   HGB 18.6 (H) 03/25/2016   HCT 54.3 (H) 03/25/2016   MCV 94.3 03/25/2016   PLT 139 (L) 03/25/2016   Lab Results  Component Value Date   NA 139 03/25/2016   K 4.8 03/25/2016   CL 106 03/25/2016   CO2 27 03/25/2016   Lab Results  Component Value Date   ALT 19 03/25/2016   AST 27 03/25/2016   ALKPHOS 46 03/25/2016   BILITOT 2.5 (H) 03/25/2016     RADIOGRAPHY: MR Brain W Wo Contrast  Result Date: 09/18/2022 CLINICAL DATA:  Brain/CNS neoplasm, monitor 3T SRS Pituitary Protocol for radiation treatment planning EXAM: MRI HEAD WITHOUT AND WITH CONTRAST TECHNIQUE: Multiplanar, multiecho pulse sequences of the brain  and surrounding structures were obtained without and with intravenous contrast. CONTRAST:  9mL Vueway COMPARISON:  MRI May 29, 2022. FINDINGS: Brain: No acute infarction, hemorrhage, hydrocephalus, extra-axial collection or new mass lesion. Similar size of a lobulated multilobular sellar/suprasellar mass. Similar early involvement of the right cavernous sinus with abutment of the prechiasmatic optic nerves Vascular: Major arterial flow voids are maintained at the skull base. Skull and upper cervical spine: Normal marrow signal. Sinuses/Orbits: Postoperative changes of the sphenoid sinuses. IMPRESSION: In comparison to May 2024, similar size/extent of a sellar/suprasellar mass compatible with a pituitary macroadenoma. Electronically Signed   By: Feliberto Harts M.D.   On: 09/18/2022 09:30     We personally reviewed his imaging.   IMPRESSION: 72 yo gentleman with recurrent macroadenoma; s/p transphenoidal resection in 2011 and repeat resection in 2017.   It was a pleasure meeting this patient and his family today. Recent imaging indicates adenoma recurrence. We personally reviewed his imaging. Given his previous surgical history, Dr. Lovell Sheehan did not recommend another resection. Therefore, he is a strong candidate for stereotactic radiosurgery [facility]) to the visualized enhancement to treat his disease recurrence.   Today, we discussed the nature of pituitary tumors and the role radiation plays in treatment. We reviewed the logistics of treatment and the benefits, risks, and possible side effects. Given the location of the tumor, damage to the optic nerve is a significant risk to this treatment. To minimize this risk, we will administer 5 fractions of SRS. All questions were answered to the patient's satisfaction. He and his wife expressed understanding of the stated plan and are ready to proceed with treatment. A consent form was signed and placed in the patient's chart. We look forward to participating in his  care.   PLAN: Patient is scheduled for CT simulation later today with his first treatment on 09/25/22. Anticipate 25 Gy in 5 fractions.   I spent 60 minutes minutes face to face with the patient and more than 50% of that time was spent in counseling and/or coordination of care.   ------------------------------------------------   Joyice Faster, PA-C    Margaretmary Dys, MD  [facility]  Radiation Oncology Direct Dial: [phone]  Fax: [phone] [email or web address]  Skype  [email or web address]

## 2022-09-19 NOTE — Progress Notes (Signed)
  Radiation Oncology         [phone] ________________________________  Name: Seth Estrada MRN: [id number]  Date: 09/18/2022  DOB: [birth year]  SIMULATION AND TREATMENT PLANNING NOTE    ICD-10-CM   1. Pituitary adenoma (HCC)  D35.2       DIAGNOSIS:  72 yo gentleman with enlarging recurrent pituitary macroadenoma; s/p transphenoidal resection in 2011 and repeat resection in 2017.   NARRATIVE:  The patient was brought to the CT Simulation planning suite.  Identity was confirmed.  All relevant records and images related to the planned course of therapy were reviewed.  The patient freely provided informed written consent to proceed with treatment after reviewing the details related to the planned course of therapy. The consent form was witnessed and verified by the simulation staff. Intravenous access was established for contrast administration. Then, the patient was set-up in a stable reproducible supine position for radiation therapy.  A relocatable thermoplastic stereotactic head frame was fabricated for precise immobilization.  CT images were obtained.  Surface markings were placed.  The CT images were loaded into the planning software and fused with the patient's targeting MRI scan.  Then the target and avoidance structures were contoured.  Treatment planning then occurred.  The radiation prescription was entered and confirmed.  I have requested 3D planning  I have requested a DVH of the following structures: Brain stem, brain, left eye, right eye, lenses, optic chiasm, target volumes, uninvolved brain, and normal tissue.    SPECIAL TREATMENT PROCEDURE:  The planned course of therapy using radiation constitutes a special treatment procedure. Special care is required in the management of this patient for the following reasons. This treatment constitutes a Special Treatment Procedure for the following reason: High dose per fraction requiring special monitoring for increased toxicities of  treatment including daily imaging.  The special nature of the planned course of radiotherapy will require increased physician supervision and oversight to ensure patient's safety with optimal treatment outcomes.  This requires extended time and effort.  PLAN:  The patient will receive 25 Gy in 5 fractions.  ________________________________  Artist Pais Kathrynn Running, M.D.

## 2022-09-24 ENCOUNTER — Ambulatory Visit
Admission: RE | Admit: 2022-09-24 | Discharge: 2022-09-24 | Disposition: A | Discharge status: Home / Self Care | Payer: Medicare HMO | Source: Ambulatory Visit | Attending: Radiation Oncology | Admitting: Radiation Oncology

## 2022-09-24 DIAGNOSIS — D352 Benign neoplasm of pituitary gland: Secondary | ICD-10-CM | POA: Diagnosis not present

## 2022-09-25 ENCOUNTER — Other Ambulatory Visit: Payer: Self-pay

## 2022-09-25 VITALS — BP 130/83 | HR 52 | Temp 97.4°F | Resp 20 | Ht 71.0 in

## 2022-09-25 DIAGNOSIS — D352 Benign neoplasm of pituitary gland: Secondary | ICD-10-CM | POA: Diagnosis not present

## 2022-09-25 LAB — RAD ONC ARIA SESSION SUMMARY
Course Elapsed Days: 0
Plan Fractions Treated to Date: 1
Plan Prescribed Dose Per Fraction: 5 Gy
Plan Total Fractions Prescribed: 5
Plan Total Prescribed Dose: 25 Gy
Reference Point Dosage Given to Date: 5 Gy
Reference Point Session Dosage Given: 5 Gy
Session Number: 1

## 2022-09-25 NOTE — Op Note (Signed)
  Name: Seth Estrada  MRN: [id number]  Date: 09/25/2022   DOB: May 14, 1950  Stereotactic Radiosurgery Operative Note  PRE-OPERATIVE DIAGNOSIS:  Recurrent pituitary adenoma  POST-OPERATIVE DIAGNOSIS:  Same  PROCEDURE:  Stereotactic Radiosurgery  SURGEON:  Jadene Pierini, MD  NARRATIVE: The patient underwent a radiation treatment planning session in the radiation oncology simulation suite under the care of the radiation oncology physician and physicist.  I participated closely in the radiation treatment planning afterwards. The patient underwent planning CT which was fused to 3T high resolution MRI with 1 mm axial slices.  These images were fused on the planning system.  We contoured the gross target volumes and subsequently expanded this to yield the Planning Target Volume. I actively participated in the planning process.  I helped to define and review the target contours and also the contours of the optic pathway, eyes, brainstem and selected nearby organs at risk.  All the dose constraints for critical structures were reviewed and compared to AAPM Task Group 101.  The prescription dose conformity was reviewed.  I approved the plan electronically.    Accordingly, [name] was brought to the TrueBeam stereotactic radiation treatment linac and placed in the custom immobilization mask.  The patient was aligned according to the IR fiducial markers with BrainLab Exactrac, then orthogonal x-rays were used in ExacTrac with the 6DOF robotic table and the shifts were made to align the patient  [name] received stereotactic radiosurgery uneventfully.    Lesions treated:  1   Complex lesions treated:  1 (>3.5 cm, <77mm of optic path, or within the brainstem)   The detailed description of the procedure is recorded in the radiation oncology procedure note.  I was present for the duration of the procedure.  DISPOSITION:  Following delivery, the patient was transported to nursing in stable  condition and monitored for possible acute effects to be discharged to home in stable condition with follow-up in one month.  Jadene Pierini, MD 09/25/2022 3:30 PM

## 2022-09-25 NOTE — Progress Notes (Signed)
Patient to nursing for 30 minute observation SRS Pituitary.  Denies headache, ringing of ears, visual changes, fatigue, and nausea.  Speech clear. Independently walked out of clinic without assist. RN advised patient not to do anything strenuous for next 24 hours and to call [phone] if any symptoms should develop.  No Decadron order.  Vitals:  97.4-52-20-130/83 O2 sat 98% room air.

## 2022-09-26 NOTE — Progress Notes (Signed)
  Radiation Oncology         [phone] ________________________________  Stereotactic Treatment Procedure Note  Name: Seth Estrada MRN: [id number]  Date: 09/25/2022  DOB: 04-15-1950  SPECIAL TREATMENT PROCEDURE    ICD-10-CM   1. Pituitary adenoma (HCC)  D35.2       3D TREATMENT PLANNING AND DOSIMETRY:  The patient's radiation plan was reviewed and approved by neurosurgery and radiation oncology prior to treatment.  It showed 3-dimensional radiation distributions overlaid onto the planning CT/MRI image set.  The [facility] for the target structures as well as the organs at risk were reviewed. The documentation of the 3D plan and dosimetry are filed in the radiation oncology EMR.  NARRATIVE:  Seth Estrada was brought to the TrueBeam stereotactic radiation treatment machine and placed supine on the CT couch. The head frame was applied, and the patient was set up for stereotactic radiosurgery.  Neurosurgery was present for the set-up and delivery  SIMULATION VERIFICATION:  In the couch zero-angle position, the patient underwent Exactrac imaging using the Brainlab system with orthogonal KV images.  These were carefully aligned and repeated to confirm treatment position for each of the isocenters.  The Exactrac snap film verification was repeated at each couch angle.  PROCEDURE: Seth Estrada received stereotactic radiosurgery to the following targets: The 11 mm pituitary adenoma target was treated using 5 Rapid Arc VMAT Beams to a prescription dose of 5 Gy to be repeated for five fractions to 25 Gy.  ExacTrac registration was performed for each couch angle.  The 100% isodose line was prescribed.  6 MV X-rays were delivered in the flattening filter free beam mode.   STEREOTACTIC TREATMENT MANAGEMENT:  Following delivery, the patient was transported to nursing in stable condition and monitored for possible acute effects.  Vital signs were recorded BP 130/83 (BP Location: Left Arm, Patient  Position: Sitting, Cuff Size: Large)   Pulse (!) 52   Temp (!) 97.4 F (36.3 C)   Resp 20   Ht 5\' 11"  (1.803 m)   SpO2 98%   BMI 26.99 kg/m . The patient tolerated treatment without significant acute effects, and was discharged to home in stable condition.    PLAN: Complete treatment and follow-up in one month.  ________________________________  Seth Estrada, M.D.

## 2022-09-27 ENCOUNTER — Ambulatory Visit
Admission: RE | Admit: 2022-09-27 | Discharge: 2022-09-27 | Disposition: A | Discharge status: Home / Self Care | Payer: Medicare HMO | Source: Ambulatory Visit | Attending: Radiation Oncology | Admitting: Radiation Oncology

## 2022-09-27 ENCOUNTER — Other Ambulatory Visit: Payer: Self-pay

## 2022-09-27 DIAGNOSIS — D352 Benign neoplasm of pituitary gland: Secondary | ICD-10-CM | POA: Diagnosis not present

## 2022-09-27 LAB — RAD ONC ARIA SESSION SUMMARY
Course Elapsed Days: 2
Plan Fractions Treated to Date: 2
Plan Prescribed Dose Per Fraction: 5 Gy
Plan Total Fractions Prescribed: 5
Plan Total Prescribed Dose: 25 Gy
Reference Point Dosage Given to Date: 10 Gy
Reference Point Session Dosage Given: 5 Gy
Session Number: 2

## 2022-09-27 NOTE — Progress Notes (Signed)
Patient to nursing for 15 minute observation SRS Pituitary. Denies headache, ringing of ears, visual changes, fatigue, and nausea. Speech clear. Independently walked out of clinic without assist. RN advised patient not to do anything strenuous for next 24 hours and to call [phone] if any symptoms should develop. No Decadron order. Vitals: 97.5-63-18-136/89 O2 sat 98% room air.

## 2022-09-30 ENCOUNTER — Other Ambulatory Visit: Payer: Self-pay

## 2022-09-30 ENCOUNTER — Ambulatory Visit
Admission: RE | Admit: 2022-09-30 | Discharge: 2022-09-30 | Disposition: A | Discharge status: Home / Self Care | Payer: Medicare HMO | Source: Ambulatory Visit | Attending: Radiation Oncology | Admitting: Radiation Oncology

## 2022-09-30 DIAGNOSIS — D352 Benign neoplasm of pituitary gland: Secondary | ICD-10-CM | POA: Diagnosis not present

## 2022-09-30 LAB — RAD ONC ARIA SESSION SUMMARY
Course Elapsed Days: 5
Plan Fractions Treated to Date: 3
Plan Prescribed Dose Per Fraction: 5 Gy
Plan Total Fractions Prescribed: 5
Plan Total Prescribed Dose: 25 Gy
Reference Point Dosage Given to Date: 15 Gy
Reference Point Session Dosage Given: 5 Gy
Session Number: 3

## 2022-09-30 NOTE — Progress Notes (Signed)
Patient to nursing for 15 minute observation SRS Pituitary. Denies headache, ringing of ears, visual changes, fatigue, and nausea. Speech clear. Independently walked out of clinic without assist. RN advised patient not to do anything strenuous for next 24 hours and to call [phone] if any symptoms should develop. No Decadron order. Vitals: 98.5-75-18-117/78 O2 sat 96% room air.

## 2022-10-02 ENCOUNTER — Ambulatory Visit
Admission: RE | Admit: 2022-10-02 | Discharge: 2022-10-02 | Disposition: A | Discharge status: Home / Self Care | Payer: Medicare HMO | Source: Ambulatory Visit | Attending: Radiation Oncology | Admitting: Radiation Oncology

## 2022-10-02 ENCOUNTER — Other Ambulatory Visit: Payer: Self-pay

## 2022-10-02 DIAGNOSIS — D352 Benign neoplasm of pituitary gland: Secondary | ICD-10-CM | POA: Diagnosis not present

## 2022-10-02 LAB — RAD ONC ARIA SESSION SUMMARY
Course Elapsed Days: 7
Plan Fractions Treated to Date: 4
Plan Prescribed Dose Per Fraction: 5 Gy
Plan Total Fractions Prescribed: 5
Plan Total Prescribed Dose: 25 Gy
Reference Point Dosage Given to Date: 20 Gy
Reference Point Session Dosage Given: 5 Gy
Session Number: 4

## 2022-10-02 NOTE — Progress Notes (Signed)
Patient to nursing for 15 minute observation SRS Pituitary. Denies headache, ringing of ears, visual changes, fatigue, and nausea. Speech clear. Independently walked out of clinic without assist. RN advised patient not to do anything strenuous for next 24 hours and to call [phone] if any symptoms should develop. No Decadron order. Vitals: 98.2-74-18-110/83 O2 sat 96% room air.

## 2022-10-04 ENCOUNTER — Ambulatory Visit
Admission: RE | Admit: 2022-10-04 | Discharge: 2022-10-04 | Disposition: A | Discharge status: Home / Self Care | Payer: Medicare HMO | Source: Ambulatory Visit | Attending: Radiation Oncology | Admitting: Radiation Oncology

## 2022-10-04 ENCOUNTER — Other Ambulatory Visit: Payer: Self-pay

## 2022-10-04 VITALS — BP 139/92 | HR 54 | Temp 97.5°F | Resp 18

## 2022-10-04 DIAGNOSIS — Z510 Encounter for antineoplastic radiation therapy: Secondary | ICD-10-CM | POA: Diagnosis not present

## 2022-10-04 DIAGNOSIS — D352 Benign neoplasm of pituitary gland: Secondary | ICD-10-CM | POA: Diagnosis not present

## 2022-10-04 LAB — RAD ONC ARIA SESSION SUMMARY
Course Elapsed Days: 9
Plan Fractions Treated to Date: 5
Plan Prescribed Dose Per Fraction: 5 Gy
Plan Total Fractions Prescribed: 5
Plan Total Prescribed Dose: 25 Gy
Reference Point Dosage Given to Date: 25 Gy
Reference Point Session Dosage Given: 5 Gy
Session Number: 5

## 2022-10-04 NOTE — Progress Notes (Signed)
Patient to nursing for 15 minute observation SRS Pituitary. Denies headache, ringing of ears, visual changes, fatigue, and nausea. Speech clear. Independently walked out of clinic without assist. LPN advised patient not to do anything strenuous for next 24 hours and to call [phone] if any symptoms should develop. No Decadron order.    BP (!) 139/92   Pulse (!) 54   Temp (!) 97.5 F (36.4 C)   Resp 18   SpO2 99%

## 2022-10-07 NOTE — Radiation Completion Notes (Addendum)
  Radiation Oncology         (336) [phone] ________________________________  Name: OAKLEN THIAM MRN: [id number]  Date: 10/04/2022  DOB: 10/29/1950  Patient Name: Seth Estrada, Seth Estrada MRN: [id number] Date of Birth: 09-20-1950 Referring Physician: Autumn Patty, M.D. Date of Service: 2022-10-07 Radiation Oncologist: Margaretmary Bayley, M.D. [facility]     RADIATION ONCOLOGY END OF TREATMENT NOTE     Diagnosis: 72 yo gentleman with enlarging recurrent pituitary macroadenoma; s/p transphenoidal resection in 2011 and repeat resection in 2017.   Intent: Curative     ==========DELIVERED PLANS==========  First Treatment Date: 2022-09-25 - Last Treatment Date: 2022-10-04   Plan Name: Brain_SRT Site: Brain Technique: SBRT/SRT-IMRT Mode: Photon Dose Per Fraction: 5 Gy Prescribed Dose (Delivered / Prescribed): 25 Gy / 25 Gy Prescribed Fxs (Delivered / Prescribed): 5 / 5     ==========ON TREATMENT VISIT DATES========== 2022-09-25, 2022-09-27, 2022-09-30, 2022-10-02, 2022-10-04    See weekly On Treatment Notes in Epic for details.  He tolerated the radiation treatments relatively well with only modest fatigue.  The patient will receive a call in about one month from the radiation oncology department.  We will schedule a posttreatment MRI brain scan in December 2024 and a telephone follow-up visit thereafter to review results and recommendations.  He will continue follow up with his neurosurgeon, Dr. Maurice Small, as well.  ------------------------------------------------   Margaretmary Dys, MD [facility]  Radiation Oncology Direct Dial: [phone]  Fax: [phone] [email or web address]  Skype  LinkedIn

## 2022-10-09 ENCOUNTER — Other Ambulatory Visit: Payer: Self-pay | Admitting: Radiation Therapy

## 2022-10-09 DIAGNOSIS — D497 Neoplasm of unspecified behavior of endocrine glands and other parts of nervous system: Secondary | ICD-10-CM

## 2022-10-31 NOTE — Addendum Note (Signed)
Encounter addended by: Marcello Fennel, PA-C on: 10/31/2022 2:48 PM  Actions taken: Clinical Note Signed

## 2022-11-05 ENCOUNTER — Ambulatory Visit
Admission: RE | Admit: 2022-11-05 | Discharge: 2022-11-05 | Disposition: A | Discharge status: Home / Self Care | Payer: Medicare HMO | Source: Ambulatory Visit | Attending: Radiation Oncology | Admitting: Radiation Oncology

## 2022-11-05 NOTE — Progress Notes (Signed)
  Radiation Oncology         [phone] ________________________________  Name: Seth Estrada MRN: [id number]  Date of Service: 11/05/2022  DOB: 01-06-1951  Post Treatment Telephone Note  Diagnosis:  72 yo gentleman with enlarging recurrent pituitary macroadenoma; s/p transphenoidal resection in 2011 and repeat resection in 2017. (as documented in provider EOT note)   The patient was available for call today.  The patient did  note fatigue during radiation. The patient did not note hair loss or skin changes in the field of radiation during therapy. The patient is not taking dexamethasone. The patient does not have symptoms of  weakness or loss of control of the extremities. The patient does not have symptoms of headache. The patient does not have symptoms of seizure or uncontrolled movement. The patient does not have symptoms of changes in vision. The patient does not have changes in speech. The patient does not have confusion.   The patient was counseled that he  will be contacted by our brain and spine navigator to schedule surveillance imaging. The patient was encouraged to call if he  have not received a call to schedule imaging, or if he  develops concerns or questions regarding radiation. The patient will also continue to follow up with Dr. Maurice Small in medical oncology.   This concludes the interaction.  Ruel Favors, LPN

## 2022-11-23 DIAGNOSIS — S51811A Laceration without foreign body of right forearm, initial encounter: Secondary | ICD-10-CM | POA: Diagnosis not present

## 2022-11-28 DIAGNOSIS — L570 Actinic keratosis: Secondary | ICD-10-CM | POA: Diagnosis not present

## 2022-11-28 DIAGNOSIS — L814 Other melanin hyperpigmentation: Secondary | ICD-10-CM | POA: Diagnosis not present

## 2022-11-28 DIAGNOSIS — D692 Other nonthrombocytopenic purpura: Secondary | ICD-10-CM | POA: Diagnosis not present

## 2022-11-28 DIAGNOSIS — L821 Other seborrheic keratosis: Secondary | ICD-10-CM | POA: Diagnosis not present

## 2022-11-29 DIAGNOSIS — M8580 Other specified disorders of bone density and structure, unspecified site: Secondary | ICD-10-CM | POA: Diagnosis not present

## 2022-11-29 DIAGNOSIS — E230 Hypopituitarism: Secondary | ICD-10-CM | POA: Diagnosis not present

## 2022-11-29 DIAGNOSIS — D352 Benign neoplasm of pituitary gland: Secondary | ICD-10-CM | POA: Diagnosis not present

## 2022-11-29 DIAGNOSIS — E2740 Unspecified adrenocortical insufficiency: Secondary | ICD-10-CM | POA: Diagnosis not present

## 2022-11-29 DIAGNOSIS — Z125 Encounter for screening for malignant neoplasm of prostate: Secondary | ICD-10-CM | POA: Diagnosis not present

## 2022-12-31 ENCOUNTER — Ambulatory Visit
Admission: RE | Admit: 2022-12-31 | Discharge: 2022-12-31 | Disposition: A | Discharge status: Home / Self Care | Payer: Medicare HMO | Source: Ambulatory Visit | Attending: Radiation Oncology | Admitting: Radiation Oncology

## 2022-12-31 DIAGNOSIS — D352 Benign neoplasm of pituitary gland: Secondary | ICD-10-CM | POA: Diagnosis not present

## 2022-12-31 DIAGNOSIS — D497 Neoplasm of unspecified behavior of endocrine glands and other parts of nervous system: Secondary | ICD-10-CM

## 2022-12-31 MED ORDER — GADOPICLENOL 0.5 MMOL/ML IV SOLN
9.0000 mL | Freq: Once | INTRAVENOUS | Status: AC | PRN
  Administered 2022-12-31: 9 mL via INTRAVENOUS

## 2023-01-06 ENCOUNTER — Ambulatory Visit: Payer: Medicare HMO | Admitting: Radiology

## 2023-01-07 ENCOUNTER — Encounter: Payer: Self-pay | Admitting: Urology

## 2023-01-07 NOTE — Progress Notes (Signed)
Telephone nursing appointment for review of most recent MRI results. I verified patient's identity x2 and began nursing interview.   Patient reports doing well. Patient denies any related issues at this time.   Meaningful use complete.   Patient aware of their 11:30am-01/08/2023 telephone appointment w/ Ashlyn Bruning PA-C. I left my extension [phone] in case patient needs anything. Patient verbalized understanding. This concludes the nursing interview.   Patient contact [phone]     Ruel Favors, LPN

## 2023-01-08 ENCOUNTER — Ambulatory Visit
Admission: RE | Admit: 2023-01-08 | Discharge: 2023-01-08 | Disposition: A | Discharge status: Home / Self Care | Payer: Medicare HMO | Source: Ambulatory Visit | Attending: Urology | Admitting: Urology

## 2023-01-08 DIAGNOSIS — D352 Benign neoplasm of pituitary gland: Secondary | ICD-10-CM

## 2023-01-08 NOTE — Progress Notes (Signed)
Radiation Oncology         [phone] ________________________________  Name: Seth Estrada MRN: [id number]  Date: 01/08/2023  DOB: 1950/06/09  Post Treatment Note  CC: Seth Noble, MD (Inactive)  No ref. provider found  Diagnosis:   72 yo gentleman with enlarging recurrent pituitary macroadenoma; s/p transphenoidal resection in 2011 and repeat resection in 2017.   Interval Since Last Radiation:  3 months  09/25/22 - 10/04/22: fractionated SRS// The recurrent pituitary adenoma was treated to 25 Gy in 5 fractions of 5 Gy each  Narrative:  I spoke with the patient to conduct his routine scheduled 3 month post-treatment follow up visit to review results of recent MRI brain scan via telephone to spare the patient unnecessary potential exposure in the healthcare setting during the current COVID-19 pandemic.  The patient was notified in advance and gave permission to proceed with this visit format. He tolerated the radiation treatments relatively well with only modest fatigue which has now completely resolved.                              On review of systems, the patient states that he is doing very well in general and without complaints.  He reports that his energy level is back to normal and he has been able to continue playing golf regularly.  He denies recent headaches, changes in visual or auditory acuity, dizziness or imbalance.  Overall, he is pleased with his progress to date. His posttreatment MRI brain scan from 12/31/2022 shows a stable appearance of the treated mass along the anterior aspect of the sella with slight suprasellar extension and bulging into the right cavernous sinus, consistent with treated recurrent pituitary adenoma. We reviewed these results today by telephone.  ALLERGIES:  is allergic to garlic.  Meds: Current Outpatient Medications  Medication Sig Dispense Refill   aspirin EC 81 MG tablet Take 1 tablet (81 mg total) by mouth daily.     BOOSTRIX 5-2.5-18.5  LF-MCG/0.5 injection      fexofenadine (ALLEGRA) 180 MG tablet Take 180 mg by mouth daily.     fluticasone (FLONASE) 50 MCG/ACT nasal spray Place 1 spray into both nostrils daily.     hydrocortisone (CORTEF) 10 MG tablet Take 10-15 mg by mouth 2 (two) times daily. 15 mg in the morning 10mg  in the evening  4   hydrocortisone sodium succinate (SOLU-CORTEF) 100 MG injection 100 mg.     levothyroxine (SYNTHROID, LEVOTHROID) 50 MCG tablet Take 50 mcg by mouth.     meloxicam (MOBIC) 15 MG tablet 15 mg.     rosuvastatin (CRESTOR) 5 MG tablet Take 5 mg by mouth at bedtime.      SHINGRIX injection      testosterone cypionate (DEPOTESTOTERONE CYPIONATE) 200 MG/ML injection Inject 0.6 mLs into the muscle every 14 (fourteen) days.  3   No current facility-administered medications for this encounter.    Physical Findings:  vitals were not taken for this visit.  Pain Assessment Pain Score: 0-No pain/10 Unable to assess due to telephone follow-up visit format.  Lab Findings: Lab Results  Component Value Date   WBC 11.0 (H) 03/25/2016   HGB 18.6 (H) 03/25/2016   HCT 54.3 (H) 03/25/2016   MCV 94.3 03/25/2016   PLT 139 (L) 03/25/2016     Radiographic Findings: MR Brain W Wo Contrast Result Date: 01/01/2023 CLINICAL DATA:  Brain/CNS neoplasm, assess treatment response 3T [facility] Pituitary Protocol. Follow-up  of treated recurrent pituitary adenoma. EXAM: MRI HEAD AND PITUITARY WITHOUT AND WITH CONTRAST TECHNIQUE: Multiplanar, multiecho pulse sequences of the brain, pituitary and surrounding structures were obtained without and with intravenous contrast. CONTRAST:  9 mL Vueway. COMPARISON:  MRI brain 09/17/2022. FINDINGS: Brain: No acute infarct or hemorrhage. Unchanged mild chronic small-vessel disease. No hydrocephalus or extra-axial collection. No abnormal susceptibility. Pituitary/Sella: Unchanged heterogeneously enhancing mass along the anterior aspect of the sella with slight suprasellar extension  and bulging into the right cavernous sinus. The mass measures up to 23 x 19 x 11 mm (coronal image 8 series 20, sagittal image 6 series 19). The infundibulum is slightly deviated to the right. The hypothalamus and mamillary bodies are normal. Mild mass effect on the cisternal segment of the right optic nerve. The infundibular and chiasmatic recesses are clear. Normal internal carotid artery flow voids. Vascular: Normal flow voids and vessel enhancement. Skull and upper cervical spine: Normal marrow signal and enhancement. Sinuses/Orbits: Postoperative changes of prior trans-sphenoidal surgery with fat grafting in the sphenoid sinuses. Other: None. IMPRESSION: Unchanged heterogeneously enhancing mass along the anterior aspect of the sella with slight suprasellar extension and bulging into the right cavernous sinus, consistent with treated recurrent pituitary adenoma. Electronically Signed   By: Seth Estrada M.D.   On: 01/01/2023 12:56    Impression/Plan: 1. 72 yo gentleman with enlarging recurrent pituitary macroadenoma; s/p transphenoidal resection in 2011 and repeat resection in 2017.  He appears to have recovered from the effects of his recent fractionated SRS treatments and is currently without complaints.  His posttreatment MRI brain scan from 12/31/2022 shows a stable appearance of the treated mass along the anterior aspect of the sella with slight suprasellar extension and bulging into the right cavernous sinus, consistent with treated recurrent pituitary adenoma.  We discussed the plan for a repeat MRI brain scan in approximately 3 months and a follow-up visit with neurosurgeon, Dr. Maisie Estrada, there after, to review results and recommendations going forward.  We will continue to participate in reviewing his scans at the multidisciplinary brain conference and plan to see him back on an as-needed basis.  He knows that he is welcome to call at anytime with any questions or concerns related to his radiation  treatment.  We enjoyed taking care of him and look forward to continuing to follow along in his care via correspondence.   I personally spent 30 minutes in this encounter including chart review, reviewing radiological studies, telephone conversation with the patient, entering orders, coordinating care and completing documentation.    Marguarite Arbour, PA-C

## 2023-01-23 ENCOUNTER — Other Ambulatory Visit: Payer: Self-pay | Admitting: Radiation Therapy

## 2023-01-23 DIAGNOSIS — D352 Benign neoplasm of pituitary gland: Secondary | ICD-10-CM

## 2023-01-28 DIAGNOSIS — E230 Hypopituitarism: Secondary | ICD-10-CM | POA: Diagnosis not present

## 2023-01-28 DIAGNOSIS — M8580 Other specified disorders of bone density and structure, unspecified site: Secondary | ICD-10-CM | POA: Diagnosis not present

## 2023-02-26 DIAGNOSIS — M7062 Trochanteric bursitis, left hip: Secondary | ICD-10-CM | POA: Diagnosis not present

## 2023-04-03 DIAGNOSIS — H26492 Other secondary cataract, left eye: Secondary | ICD-10-CM | POA: Diagnosis not present

## 2023-04-08 ENCOUNTER — Ambulatory Visit
Admission: RE | Admit: 2023-04-08 | Discharge: 2023-04-08 | Disposition: A | Discharge status: Home / Self Care | Payer: Medicare HMO | Source: Ambulatory Visit | Attending: Radiation Oncology | Admitting: Radiation Oncology

## 2023-04-08 DIAGNOSIS — D352 Benign neoplasm of pituitary gland: Secondary | ICD-10-CM

## 2023-04-08 MED ORDER — GADOPICLENOL 0.5 MMOL/ML IV SOLN
7.5000 mL | Freq: Once | INTRAVENOUS | Status: AC | PRN
  Administered 2023-04-08: 7.5 mL via INTRAVENOUS

## 2023-04-14 ENCOUNTER — Encounter

## 2023-04-18 DIAGNOSIS — Z6826 Body mass index (BMI) 26.0-26.9, adult: Secondary | ICD-10-CM | POA: Diagnosis not present

## 2023-04-18 DIAGNOSIS — D352 Benign neoplasm of pituitary gland: Secondary | ICD-10-CM | POA: Diagnosis not present

## 2023-05-02 DIAGNOSIS — M25561 Pain in right knee: Secondary | ICD-10-CM | POA: Diagnosis not present

## 2023-05-07 DIAGNOSIS — M7041 Prepatellar bursitis, right knee: Secondary | ICD-10-CM | POA: Diagnosis not present

## 2023-05-14 DIAGNOSIS — R351 Nocturia: Secondary | ICD-10-CM | POA: Diagnosis not present

## 2023-05-14 DIAGNOSIS — Z1211 Encounter for screening for malignant neoplasm of colon: Secondary | ICD-10-CM | POA: Diagnosis not present

## 2023-05-14 DIAGNOSIS — M7041 Prepatellar bursitis, right knee: Secondary | ICD-10-CM | POA: Diagnosis not present

## 2023-05-14 DIAGNOSIS — M8580 Other specified disorders of bone density and structure, unspecified site: Secondary | ICD-10-CM | POA: Diagnosis not present

## 2023-05-14 DIAGNOSIS — E230 Hypopituitarism: Secondary | ICD-10-CM | POA: Diagnosis not present

## 2023-05-14 DIAGNOSIS — N529 Male erectile dysfunction, unspecified: Secondary | ICD-10-CM | POA: Diagnosis not present

## 2023-05-14 DIAGNOSIS — E785 Hyperlipidemia, unspecified: Secondary | ICD-10-CM | POA: Diagnosis not present

## 2023-05-14 DIAGNOSIS — D352 Benign neoplasm of pituitary gland: Secondary | ICD-10-CM | POA: Diagnosis not present

## 2023-05-14 DIAGNOSIS — Z800 Family history of malignant neoplasm of digestive organs: Secondary | ICD-10-CM | POA: Diagnosis not present

## 2023-05-14 DIAGNOSIS — Z125 Encounter for screening for malignant neoplasm of prostate: Secondary | ICD-10-CM | POA: Diagnosis not present

## 2023-05-14 DIAGNOSIS — Z79899 Other long term (current) drug therapy: Secondary | ICD-10-CM | POA: Diagnosis not present

## 2023-05-14 DIAGNOSIS — E291 Testicular hypofunction: Secondary | ICD-10-CM | POA: Diagnosis not present

## 2023-05-21 DIAGNOSIS — M7041 Prepatellar bursitis, right knee: Secondary | ICD-10-CM | POA: Diagnosis not present

## 2023-05-21 DIAGNOSIS — M25561 Pain in right knee: Secondary | ICD-10-CM | POA: Diagnosis not present

## 2023-05-22 DIAGNOSIS — H26492 Other secondary cataract, left eye: Secondary | ICD-10-CM | POA: Diagnosis not present

## 2023-06-03 DIAGNOSIS — E230 Hypopituitarism: Secondary | ICD-10-CM | POA: Diagnosis not present

## 2023-06-03 DIAGNOSIS — E785 Hyperlipidemia, unspecified: Secondary | ICD-10-CM | POA: Diagnosis not present

## 2023-06-03 DIAGNOSIS — R748 Abnormal levels of other serum enzymes: Secondary | ICD-10-CM | POA: Diagnosis not present

## 2023-06-05 DIAGNOSIS — E230 Hypopituitarism: Secondary | ICD-10-CM | POA: Diagnosis not present

## 2023-06-05 DIAGNOSIS — Z125 Encounter for screening for malignant neoplasm of prostate: Secondary | ICD-10-CM | POA: Diagnosis not present

## 2023-06-05 DIAGNOSIS — D352 Benign neoplasm of pituitary gland: Secondary | ICD-10-CM | POA: Diagnosis not present

## 2023-06-05 DIAGNOSIS — M8580 Other specified disorders of bone density and structure, unspecified site: Secondary | ICD-10-CM | POA: Diagnosis not present

## 2023-07-23 DIAGNOSIS — K649 Unspecified hemorrhoids: Secondary | ICD-10-CM | POA: Diagnosis not present

## 2023-07-23 DIAGNOSIS — M542 Cervicalgia: Secondary | ICD-10-CM | POA: Diagnosis not present

## 2023-07-30 DIAGNOSIS — Z961 Presence of intraocular lens: Secondary | ICD-10-CM | POA: Diagnosis not present

## 2023-07-30 DIAGNOSIS — H35371 Puckering of macula, right eye: Secondary | ICD-10-CM | POA: Diagnosis not present

## 2023-08-04 DIAGNOSIS — L738 Other specified follicular disorders: Secondary | ICD-10-CM | POA: Diagnosis not present

## 2023-08-04 DIAGNOSIS — C44319 Basal cell carcinoma of skin of other parts of face: Secondary | ICD-10-CM | POA: Diagnosis not present

## 2023-08-04 DIAGNOSIS — L570 Actinic keratosis: Secondary | ICD-10-CM | POA: Diagnosis not present

## 2023-08-04 DIAGNOSIS — D485 Neoplasm of uncertain behavior of skin: Secondary | ICD-10-CM | POA: Diagnosis not present

## 2023-08-07 DIAGNOSIS — M62838 Other muscle spasm: Secondary | ICD-10-CM | POA: Diagnosis not present

## 2023-08-07 DIAGNOSIS — M542 Cervicalgia: Secondary | ICD-10-CM | POA: Diagnosis not present

## 2023-08-13 DIAGNOSIS — M542 Cervicalgia: Secondary | ICD-10-CM | POA: Diagnosis not present

## 2023-08-13 DIAGNOSIS — M62838 Other muscle spasm: Secondary | ICD-10-CM | POA: Diagnosis not present

## 2023-08-13 DIAGNOSIS — R519 Headache, unspecified: Secondary | ICD-10-CM | POA: Diagnosis not present

## 2023-08-25 DIAGNOSIS — M62838 Other muscle spasm: Secondary | ICD-10-CM | POA: Diagnosis not present

## 2023-08-25 DIAGNOSIS — M542 Cervicalgia: Secondary | ICD-10-CM | POA: Diagnosis not present

## 2023-08-31 DIAGNOSIS — U071 COVID-19: Secondary | ICD-10-CM | POA: Diagnosis not present

## 2023-09-11 DIAGNOSIS — C44319 Basal cell carcinoma of skin of other parts of face: Secondary | ICD-10-CM | POA: Diagnosis not present

## 2023-09-15 DIAGNOSIS — D352 Benign neoplasm of pituitary gland: Secondary | ICD-10-CM | POA: Diagnosis not present

## 2023-09-15 DIAGNOSIS — Z6827 Body mass index (BMI) 27.0-27.9, adult: Secondary | ICD-10-CM | POA: Diagnosis not present

## 2023-09-19 ENCOUNTER — Other Ambulatory Visit: Payer: Self-pay | Admitting: Neurosurgery

## 2023-09-19 DIAGNOSIS — D352 Benign neoplasm of pituitary gland: Secondary | ICD-10-CM

## 2023-09-23 ENCOUNTER — Encounter: Payer: Self-pay | Admitting: Neurosurgery

## 2023-10-03 DIAGNOSIS — M62838 Other muscle spasm: Secondary | ICD-10-CM | POA: Diagnosis not present

## 2023-10-03 DIAGNOSIS — M542 Cervicalgia: Secondary | ICD-10-CM | POA: Diagnosis not present

## 2023-10-05 ENCOUNTER — Ambulatory Visit
Admission: RE | Admit: 2023-10-05 | Discharge: 2023-10-05 | Disposition: A | Discharge status: Home / Self Care | Source: Ambulatory Visit | Attending: Neurosurgery | Admitting: Neurosurgery

## 2023-10-05 DIAGNOSIS — D352 Benign neoplasm of pituitary gland: Secondary | ICD-10-CM

## 2023-10-05 DIAGNOSIS — E236 Other disorders of pituitary gland: Secondary | ICD-10-CM | POA: Diagnosis not present

## 2023-10-05 MED ORDER — GADOPICLENOL 0.5 MMOL/ML IV SOLN
9.0000 mL | Freq: Once | INTRAVENOUS | Status: AC | PRN
Start: 2023-10-05 — End: 2023-10-05
  Administered 2023-10-05: 9 mL via INTRAVENOUS

## 2023-11-12 DIAGNOSIS — M25512 Pain in left shoulder: Secondary | ICD-10-CM | POA: Diagnosis not present

## 2023-12-01 DIAGNOSIS — R590 Localized enlarged lymph nodes: Secondary | ICD-10-CM | POA: Diagnosis not present

## 2023-12-01 DIAGNOSIS — E230 Hypopituitarism: Secondary | ICD-10-CM | POA: Diagnosis not present

## 2023-12-03 DIAGNOSIS — A46 Erysipelas: Secondary | ICD-10-CM | POA: Diagnosis not present

## 2023-12-10 DIAGNOSIS — E230 Hypopituitarism: Secondary | ICD-10-CM | POA: Diagnosis not present

## 2023-12-10 DIAGNOSIS — E291 Testicular hypofunction: Secondary | ICD-10-CM | POA: Diagnosis not present

## 2023-12-10 DIAGNOSIS — M8580 Other specified disorders of bone density and structure, unspecified site: Secondary | ICD-10-CM | POA: Diagnosis not present

## 2023-12-10 DIAGNOSIS — Z125 Encounter for screening for malignant neoplasm of prostate: Secondary | ICD-10-CM | POA: Diagnosis not present

## 2023-12-10 DIAGNOSIS — D352 Benign neoplasm of pituitary gland: Secondary | ICD-10-CM | POA: Diagnosis not present

## 2023-12-24 DIAGNOSIS — E291 Testicular hypofunction: Secondary | ICD-10-CM | POA: Diagnosis not present

## 2023-12-31 DIAGNOSIS — D044 Carcinoma in situ of skin of scalp and neck: Secondary | ICD-10-CM | POA: Diagnosis not present

## 2023-12-31 DIAGNOSIS — D485 Neoplasm of uncertain behavior of skin: Secondary | ICD-10-CM | POA: Diagnosis not present

## 2023-12-31 DIAGNOSIS — L821 Other seborrheic keratosis: Secondary | ICD-10-CM | POA: Diagnosis not present

## 2023-12-31 DIAGNOSIS — Z85828 Personal history of other malignant neoplasm of skin: Secondary | ICD-10-CM | POA: Diagnosis not present

## 2023-12-31 DIAGNOSIS — L570 Actinic keratosis: Secondary | ICD-10-CM | POA: Diagnosis not present

## 2023-12-31 DIAGNOSIS — L080 Pyoderma: Secondary | ICD-10-CM | POA: Diagnosis not present
